# Patient Record
Sex: Female | Born: 1955 | Race: White | Hispanic: No | Smoking: Never smoker
Health system: Southern US, Community
[De-identification: ages and names within clinical notes are randomized; demographics above are authoritative.]

## PROBLEM LIST (undated history)

## (undated) DIAGNOSIS — F419 Anxiety disorder, unspecified: Secondary | ICD-10-CM

## (undated) DIAGNOSIS — I10 Essential (primary) hypertension: Secondary | ICD-10-CM

## (undated) DIAGNOSIS — E7800 Pure hypercholesterolemia, unspecified: Secondary | ICD-10-CM

## (undated) HISTORY — PX: ABDOMINAL HYSTERECTOMY: SHX81

## (undated) HISTORY — PX: BREAST SURGERY: SHX581

## (undated) HISTORY — PX: DEBRIDEMENT TENNIS ELBOW: SHX1442

## (undated) HISTORY — PX: OTHER SURGICAL HISTORY: SHX169

## (undated) HISTORY — PX: APPENDECTOMY: SHX54

## (undated) HISTORY — PX: LIPOMA EXCISION: SHX5283

---

## 2011-09-05 ENCOUNTER — Encounter (HOSPITAL_COMMUNITY): Payer: Self-pay | Admitting: Pharmacy Technician

## 2011-09-05 ENCOUNTER — Other Ambulatory Visit (HOSPITAL_COMMUNITY): Payer: Self-pay | Admitting: Plastic Surgery

## 2011-09-05 ENCOUNTER — Other Ambulatory Visit: Payer: Self-pay | Admitting: Radiology

## 2011-09-05 ENCOUNTER — Encounter (HOSPITAL_COMMUNITY): Payer: Self-pay

## 2011-09-05 ENCOUNTER — Ambulatory Visit (HOSPITAL_COMMUNITY)
Admission: RE | Admit: 2011-09-05 | Discharge: 2011-09-05 | Disposition: A | Discharge status: Home / Self Care | Payer: 59 | Source: Ambulatory Visit | Attending: Plastic Surgery | Admitting: Plastic Surgery

## 2011-09-05 DIAGNOSIS — IMO0002 Reserved for concepts with insufficient information to code with codable children: Secondary | ICD-10-CM

## 2011-09-05 DIAGNOSIS — Y849 Medical procedure, unspecified as the cause of abnormal reaction of the patient, or of later complication, without mention of misadventure at the time of the procedure: Secondary | ICD-10-CM | POA: Insufficient documentation

## 2011-09-05 HISTORY — DX: Essential (primary) hypertension: I10

## 2011-09-05 HISTORY — DX: Anxiety disorder, unspecified: F41.9

## 2011-09-05 LAB — CBC
MCH: 32.4 pg (ref 26.0–34.0)
MCV: 93.8 fL (ref 78.0–100.0)
Platelets: 228 10*3/uL (ref 150–400)
RBC: 4.17 MIL/uL (ref 3.87–5.11)

## 2011-09-05 MED ORDER — SODIUM CHLORIDE 0.9 % IV SOLN: Freq: Once | INTRAVENOUS | Status: DC

## 2011-09-05 MED ORDER — MIDAZOLAM HCL 2 MG/2ML IJ SOLN
INTRAMUSCULAR | Status: AC
  Filled 2011-09-05: qty 4

## 2011-09-05 MED ORDER — FENTANYL CITRATE 0.05 MG/ML IJ SOLN
INTRAMUSCULAR | Status: AC
  Filled 2011-09-05: qty 4

## 2011-09-05 NOTE — Procedures (Signed)
Placement of drainage catheter (10 [place]) in back seroma with ultrasound guidance.  Removed 40 ml of red serous fluid.  No immediate complication.

## 2011-09-05 NOTE — ED Notes (Signed)
Pt sent home.  No medication given during procedure

## 2011-09-05 NOTE — H&P (Signed)
Sherry Dixon is an 56 y.o. female.   Chief Complaint: back lipoma surgery 08/20/11 Seroma developed. Has had aspirated x 2 in office 5/28 and 6/3: 60 cc each time: bloody serous fluid Re accumulated now Scheduled for [place] and aspiration vs drain placement HPI: HTN; anxiety  Past Medical History  Diagnosis Date  . Hypertension   . Anxiety     Past Surgical History  Procedure Date  . Breast surgery     augmentation  . Abdominal hysterectomy   . Lipoma excision   . Bunyon   . Debridement tennis elbow     No family history on file. Social History:  does not have a smoking history on file. She has never used smokeless tobacco. Her alcohol and drug histories not on file.  Allergies: Allergies not on file   (Not in a hospital admission)  No results found for this or any previous visit (from the past 48 hour(s)). No results found.  Review of Systems  Constitutional: Negative for fever.  Cardiovascular: Negative for chest pain.  Gastrointestinal: Negative for nausea and vomiting.  Musculoskeletal: Positive for back pain.  Neurological: Negative for dizziness and headaches.  Psychiatric/Behavioral: The patient is nervous/anxious.     Blood pressure 157/90, pulse 62, temperature 97 F (36.1 C), temperature source Oral, resp. rate 18, height 5\' 6"  (1.676 m), weight 145 lb (65.772 kg), SpO2 97.00%. Physical Exam  Constitutional: She is oriented to person, place, and time. She appears well-developed.  Cardiovascular: Normal rate, regular rhythm and normal heart sounds.   No murmur heard. Respiratory: Effort normal and breath sounds normal. She has no wheezes. She exhibits tenderness.       Mid to left back lipoma surgery 08/20/11 Seroma now  GI: Soft. Bowel sounds are normal. There is no tenderness.  Musculoskeletal: Normal range of motion.  Neurological: She is alert and oriented to person, place, and time.  Skin: Skin is warm and dry.  Psychiatric: She has a normal mood and  affect. Her behavior is normal. Judgment and thought content normal.     Assessment/Plan Back lipoma removed 08/20/11 Seroma aspirated x 2 - in office; reacumulation now Scheduled for [place] and aspiration vs drain placement Pt aware of procedure benefits and risks and agreeable to proceed. Consent signed.     Satoru Milich A 09/05/2011, 1:21 PM

## 2011-09-05 NOTE — ED Notes (Signed)
Pt showed how to work JP drain

## 2019-11-27 ENCOUNTER — Emergency Department (HOSPITAL_COMMUNITY): Payer: BLUE CROSS/BLUE SHIELD | Admitting: Registered Nurse

## 2019-11-27 ENCOUNTER — Encounter (HOSPITAL_BASED_OUTPATIENT_CLINIC_OR_DEPARTMENT_OTHER): Payer: Self-pay | Admitting: Emergency Medicine

## 2019-11-27 ENCOUNTER — Observation Stay (HOSPITAL_BASED_OUTPATIENT_CLINIC_OR_DEPARTMENT_OTHER)
Admission: EM | Admit: 2019-11-27 | Discharge: 2019-11-29 | Disposition: A | Discharge status: Home / Self Care | Payer: BLUE CROSS/BLUE SHIELD | Attending: General Surgery | Admitting: General Surgery

## 2019-11-27 ENCOUNTER — Encounter (HOSPITAL_COMMUNITY)
Admission: EM | Disposition: A | Discharge status: Home / Self Care | Payer: Self-pay | Source: Home / Self Care | Attending: Emergency Medicine

## 2019-11-27 ENCOUNTER — Other Ambulatory Visit: Payer: Self-pay

## 2019-11-27 ENCOUNTER — Emergency Department (HOSPITAL_BASED_OUTPATIENT_CLINIC_OR_DEPARTMENT_OTHER): Payer: BLUE CROSS/BLUE SHIELD

## 2019-11-27 DIAGNOSIS — Z79899 Other long term (current) drug therapy: Secondary | ICD-10-CM | POA: Diagnosis not present

## 2019-11-27 DIAGNOSIS — Z20822 Contact with and (suspected) exposure to covid-19: Secondary | ICD-10-CM | POA: Diagnosis not present

## 2019-11-27 DIAGNOSIS — Z7982 Long term (current) use of aspirin: Secondary | ICD-10-CM | POA: Insufficient documentation

## 2019-11-27 DIAGNOSIS — K3532 Acute appendicitis with perforation and localized peritonitis, without abscess: Principal | ICD-10-CM | POA: Insufficient documentation

## 2019-11-27 DIAGNOSIS — K353 Acute appendicitis with localized peritonitis, without perforation or gangrene: Secondary | ICD-10-CM

## 2019-11-27 DIAGNOSIS — I10 Essential (primary) hypertension: Secondary | ICD-10-CM | POA: Insufficient documentation

## 2019-11-27 DIAGNOSIS — R109 Unspecified abdominal pain: Secondary | ICD-10-CM | POA: Diagnosis present

## 2019-11-27 DIAGNOSIS — K3580 Unspecified acute appendicitis: Secondary | ICD-10-CM | POA: Diagnosis present

## 2019-11-27 HISTORY — PX: LAPAROSCOPIC APPENDECTOMY: SHX408

## 2019-11-27 HISTORY — DX: Pure hypercholesterolemia, unspecified: E78.00

## 2019-11-27 LAB — CREATININE, SERUM
Creatinine, Ser: 0.87 mg/dL (ref 0.44–1.00)
GFR calc Af Amer: >60 mL/min (ref >60)
GFR calc non Af Amer: >60 mL/min (ref >60)

## 2019-11-27 LAB — COMPREHENSIVE METABOLIC PANEL
ALT: 16 U/L (ref 0–44)
AST: 22 U/L (ref 15–41)
Albumin: 4 g/dL (ref 3.5–5.0)
Alkaline Phosphatase: 51 U/L (ref 38–126)
Anion gap: 13 (ref 5–15)
BUN: 23 mg/dL (ref 8–23)
CO2: 25 mmol/L (ref 22–32)
Calcium: 9.4 mg/dL (ref 8.9–10.3)
Chloride: 97 mmol/L — ABNORMAL LOW (ref 98–111)
Creatinine, Ser: 0.95 mg/dL (ref 0.44–1.00)
GFR calc Af Amer: >60 mL/min (ref >60)
GFR calc non Af Amer: >60 mL/min (ref >60)
Glucose, Bld: 128 mg/dL — ABNORMAL HIGH (ref 70–99)
Potassium: 4 mmol/L (ref 3.5–5.1)
Sodium: 135 mmol/L (ref 135–145)
Total Bilirubin: 1.3 mg/dL — ABNORMAL HIGH (ref 0.3–1.2)
Total Protein: 7.9 g/dL (ref 6.5–8.1)

## 2019-11-27 LAB — CBC WITH DIFFERENTIAL/PLATELET
Abs Immature Granulocytes: 0.06 10*3/uL (ref 0.00–0.07)
Basophils Absolute: 0.1 10*3/uL (ref 0.0–0.1)
Basophils Relative: 1 %
Eosinophils Absolute: 0.2 10*3/uL (ref 0.0–0.5)
Eosinophils Relative: 2 %
HCT: 46.9 % — ABNORMAL HIGH (ref 36.0–46.0)
Hemoglobin: 15.9 g/dL — ABNORMAL HIGH (ref 12.0–15.0)
Immature Granulocytes: 1 %
Lymphocytes Relative: 7 %
Lymphs Abs: 0.9 10*3/uL (ref 0.7–4.0)
MCH: 33.1 pg (ref 26.0–34.0)
MCHC: 33.9 g/dL (ref 30.0–36.0)
MCV: 97.5 fL (ref 80.0–100.0)
Monocytes Absolute: 0.9 10*3/uL (ref 0.1–1.0)
Monocytes Relative: 7 %
Neutro Abs: 10.5 10*3/uL — ABNORMAL HIGH (ref 1.7–7.7)
Neutrophils Relative %: 82 %
Platelets: 253 10*3/uL (ref 150–400)
RBC: 4.81 MIL/uL (ref 3.87–5.11)
RDW: 12.9 % (ref 11.5–15.5)
WBC: 12.6 10*3/uL — ABNORMAL HIGH (ref 4.0–10.5)
nRBC: 0 % (ref 0.0–0.2)

## 2019-11-27 LAB — CBC
HCT: 38.5 % (ref 36.0–46.0)
Hemoglobin: 12.9 g/dL (ref 12.0–15.0)
MCH: 33.6 pg (ref 26.0–34.0)
MCHC: 33.5 g/dL (ref 30.0–36.0)
MCV: 100.3 fL — ABNORMAL HIGH (ref 80.0–100.0)
Platelets: 200 10*3/uL (ref 150–400)
RBC: 3.84 MIL/uL — ABNORMAL LOW (ref 3.87–5.11)
RDW: 13.2 % (ref 11.5–15.5)
WBC: 10.8 10*3/uL — ABNORMAL HIGH (ref 4.0–10.5)
nRBC: 0 % (ref 0.0–0.2)

## 2019-11-27 LAB — URINALYSIS, ROUTINE W REFLEX MICROSCOPIC
Glucose, UA: NEGATIVE mg/dL
Ketones, ur: NEGATIVE mg/dL
Leukocytes,Ua: NEGATIVE
Nitrite: NEGATIVE
Protein, ur: 30 mg/dL — AB
Specific Gravity, Urine: >1.03 — ABNORMAL HIGH (ref 1.005–1.030)
pH: 6 (ref 5.0–8.0)

## 2019-11-27 LAB — URINALYSIS, MICROSCOPIC (REFLEX)

## 2019-11-27 LAB — SARS CORONAVIRUS 2 BY RT PCR (HOSPITAL ORDER, PERFORMED IN ~~LOC~~ HOSPITAL LAB): SARS Coronavirus 2: NEGATIVE

## 2019-11-27 SURGERY — APPENDECTOMY, LAPAROSCOPIC
Anesthesia: General | Site: Abdomen

## 2019-11-27 MED ORDER — ONDANSETRON HCL 4 MG/2ML IJ SOLN
INTRAMUSCULAR | Status: AC
  Filled 2019-11-27: qty 2

## 2019-11-27 MED ORDER — IOHEXOL 300 MG/ML  SOLN
100.0000 mL | Freq: Once | INTRAMUSCULAR | Status: AC | PRN
  Administered 2019-11-27: 100 mL via INTRAVENOUS

## 2019-11-27 MED ORDER — METOPROLOL TARTRATE 5 MG/5ML IV SOLN: 5.0000 mg | Freq: Four times a day (QID) | INTRAVENOUS | Status: DC | PRN

## 2019-11-27 MED ORDER — HYDROCHLOROTHIAZIDE 25 MG PO TABS
25.0000 mg | ORAL_TABLET | Freq: Every day | ORAL | Status: DC
  Administered 2019-11-27: 25 mg via ORAL
  Filled 2019-11-27: qty 1

## 2019-11-27 MED ORDER — GABAPENTIN 300 MG PO CAPS
300.0000 mg | ORAL_CAPSULE | Freq: Two times a day (BID) | ORAL | Status: DC
  Administered 2019-11-27 – 2019-11-28 (×2): 300 mg via ORAL
  Filled 2019-11-27 (×2): qty 1

## 2019-11-27 MED ORDER — 0.9 % SODIUM CHLORIDE (POUR BTL) OPTIME
TOPICAL | Status: DC | PRN
  Administered 2019-11-27: 1000 mL

## 2019-11-27 MED ORDER — LOSARTAN POTASSIUM 25 MG PO TABS: 25.0000 mg | ORAL_TABLET | Freq: Every day | ORAL | Status: DC

## 2019-11-27 MED ORDER — ONDANSETRON HCL 4 MG/2ML IJ SOLN: 4.0000 mg | Freq: Four times a day (QID) | INTRAMUSCULAR | Status: DC | PRN

## 2019-11-27 MED ORDER — BOOST / RESOURCE BREEZE PO LIQD CUSTOM
1.0000 | Freq: Three times a day (TID) | ORAL | Status: DC
  Administered 2019-11-27 – 2019-11-28 (×2): 1 via ORAL

## 2019-11-27 MED ORDER — OXYCODONE HCL 5 MG PO TABS
5.0000 mg | ORAL_TABLET | ORAL | Status: DC | PRN
  Administered 2019-11-27 – 2019-11-29 (×4): 10 mg via ORAL
  Filled 2019-11-27: qty 1
  Filled 2019-11-27 (×4): qty 2

## 2019-11-27 MED ORDER — DEXAMETHASONE SODIUM PHOSPHATE 10 MG/ML IJ SOLN
INTRAMUSCULAR | Status: DC | PRN
  Administered 2019-11-27: 10 mg via INTRAVENOUS

## 2019-11-27 MED ORDER — ATORVASTATIN CALCIUM 10 MG PO TABS
10.0000 mg | ORAL_TABLET | Freq: Every day | ORAL | Status: DC
  Administered 2019-11-27: 20:00:00 10 mg via ORAL
  Filled 2019-11-27: qty 1

## 2019-11-27 MED ORDER — DIPHENHYDRAMINE HCL 12.5 MG/5ML PO ELIX
12.5000 mg | ORAL_SOLUTION | Freq: Four times a day (QID) | ORAL | Status: DC | PRN
  Administered 2019-11-29: 08:00:00 12.5 mg via ORAL
  Filled 2019-11-27: qty 5

## 2019-11-27 MED ORDER — SIMETHICONE 80 MG PO CHEW: 40.0000 mg | CHEWABLE_TABLET | Freq: Four times a day (QID) | ORAL | Status: DC | PRN

## 2019-11-27 MED ORDER — PIPERACILLIN-TAZOBACTAM 3.375 G IVPB
3.3750 g | Freq: Three times a day (TID) | INTRAVENOUS | Status: DC
  Administered 2019-11-27: 3.375 g via INTRAVENOUS
  Filled 2019-11-27: qty 50

## 2019-11-27 MED ORDER — KCL IN DEXTROSE-NACL 20-5-0.45 MEQ/L-%-% IV SOLN
INTRAVENOUS | Status: DC
  Filled 2019-11-27 (×3): qty 1000

## 2019-11-27 MED ORDER — ASPIRIN EC 81 MG PO TBEC: 81.0000 mg | DELAYED_RELEASE_TABLET | Freq: Every day | ORAL | Status: DC

## 2019-11-27 MED ORDER — MIDAZOLAM HCL 5 MG/5ML IJ SOLN
INTRAMUSCULAR | Status: DC | PRN
  Administered 2019-11-27: 2 mg via INTRAVENOUS

## 2019-11-27 MED ORDER — KETOROLAC TROMETHAMINE 30 MG/ML IJ SOLN: 30.0000 mg | Freq: Once | INTRAMUSCULAR | Status: DC

## 2019-11-27 MED ORDER — FENTANYL CITRATE (PF) 100 MCG/2ML IJ SOLN
INTRAMUSCULAR | Status: DC | PRN
  Administered 2019-11-27 (×5): 50 ug via INTRAVENOUS

## 2019-11-27 MED ORDER — POLYETHYLENE GLYCOL 3350 17 G PO PACK: 17.0000 g | PACK | Freq: Every day | ORAL | Status: DC | PRN

## 2019-11-27 MED ORDER — SUCCINYLCHOLINE CHLORIDE 200 MG/10ML IV SOSY
PREFILLED_SYRINGE | INTRAVENOUS | Status: AC
  Filled 2019-11-27: qty 10

## 2019-11-27 MED ORDER — EPHEDRINE SULFATE 50 MG/ML IJ SOLN
INTRAMUSCULAR | Status: DC | PRN
  Administered 2019-11-27: 10 mg via INTRAVENOUS

## 2019-11-27 MED ORDER — PHENYLEPHRINE HCL (PRESSORS) 10 MG/ML IV SOLN
INTRAVENOUS | Status: DC | PRN
  Administered 2019-11-27: 80 ug via INTRAVENOUS
  Administered 2019-11-27: 160 ug via INTRAVENOUS

## 2019-11-27 MED ORDER — LACTATED RINGERS IV SOLN: INTRAVENOUS | Status: DC | PRN

## 2019-11-27 MED ORDER — ROCURONIUM BROMIDE 10 MG/ML (PF) SYRINGE
PREFILLED_SYRINGE | INTRAVENOUS | Status: AC
  Filled 2019-11-27: qty 10

## 2019-11-27 MED ORDER — SODIUM CHLORIDE 0.9 % IV SOLN
INTRAVENOUS | Status: DC | PRN
  Administered 2019-11-27: 250 mL via INTRAVENOUS

## 2019-11-27 MED ORDER — FENTANYL CITRATE (PF) 100 MCG/2ML IJ SOLN
INTRAMUSCULAR | Status: AC
  Filled 2019-11-27: qty 2

## 2019-11-27 MED ORDER — ACETAMINOPHEN 10 MG/ML IV SOLN: 1000.0000 mg | Freq: Once | INTRAVENOUS | Status: DC | PRN

## 2019-11-27 MED ORDER — ONDANSETRON HCL 4 MG/2ML IJ SOLN
4.0000 mg | Freq: Once | INTRAMUSCULAR | Status: AC
  Administered 2019-11-27: 4 mg via INTRAVENOUS
  Filled 2019-11-27: qty 2

## 2019-11-27 MED ORDER — PROPOFOL 10 MG/ML IV BOLUS
INTRAVENOUS | Status: DC | PRN
  Administered 2019-11-27: 150 mg via INTRAVENOUS

## 2019-11-27 MED ORDER — ENOXAPARIN SODIUM 40 MG/0.4ML ~~LOC~~ SOLN
40.0000 mg | SUBCUTANEOUS | Status: DC
  Administered 2019-11-28 – 2019-11-29 (×2): 40 mg via SUBCUTANEOUS
  Filled 2019-11-27 (×2): qty 0.4

## 2019-11-27 MED ORDER — METHOCARBAMOL 500 MG PO TABS
500.0000 mg | ORAL_TABLET | Freq: Four times a day (QID) | ORAL | Status: DC | PRN
  Administered 2019-11-28: 17:00:00 500 mg via ORAL
  Filled 2019-11-27 (×2): qty 1

## 2019-11-27 MED ORDER — FENTANYL CITRATE (PF) 100 MCG/2ML IJ SOLN
100.0000 ug | Freq: Once | INTRAMUSCULAR | Status: AC
  Administered 2019-11-27: 100 ug via INTRAVENOUS
  Filled 2019-11-27: qty 2

## 2019-11-27 MED ORDER — ACETAMINOPHEN 500 MG PO TABS
1000.0000 mg | ORAL_TABLET | Freq: Four times a day (QID) | ORAL | Status: DC
  Administered 2019-11-27 – 2019-11-29 (×6): 1000 mg via ORAL
  Filled 2019-11-27 (×7): qty 2

## 2019-11-27 MED ORDER — FENTANYL CITRATE (PF) 250 MCG/5ML IJ SOLN
INTRAMUSCULAR | Status: AC
Start: 2019-11-27 — End: ?
  Filled 2019-11-27: qty 5

## 2019-11-27 MED ORDER — IBUPROFEN 400 MG PO TABS: 600.0000 mg | ORAL_TABLET | Freq: Four times a day (QID) | ORAL | Status: DC | PRN

## 2019-11-27 MED ORDER — SUCCINYLCHOLINE CHLORIDE 200 MG/10ML IV SOSY
PREFILLED_SYRINGE | INTRAVENOUS | Status: DC | PRN
  Administered 2019-11-27: 120 mg via INTRAVENOUS

## 2019-11-27 MED ORDER — FENTANYL CITRATE (PF) 100 MCG/2ML IJ SOLN
25.0000 ug | INTRAMUSCULAR | Status: DC | PRN
  Administered 2019-11-28 (×2): 25 ug via INTRAVENOUS
  Filled 2019-11-27 (×2): qty 2

## 2019-11-27 MED ORDER — ROCURONIUM BROMIDE 10 MG/ML (PF) SYRINGE
PREFILLED_SYRINGE | INTRAVENOUS | Status: DC | PRN
  Administered 2019-11-27: 40 mg via INTRAVENOUS

## 2019-11-27 MED ORDER — ONDANSETRON HCL 4 MG/2ML IJ SOLN
INTRAMUSCULAR | Status: DC | PRN
  Administered 2019-11-27: 4 mg via INTRAVENOUS

## 2019-11-27 MED ORDER — ONDANSETRON 4 MG PO TBDP: 4.0000 mg | ORAL_TABLET | Freq: Four times a day (QID) | ORAL | Status: DC | PRN

## 2019-11-27 MED ORDER — FENTANYL CITRATE (PF) 100 MCG/2ML IJ SOLN: 25.0000 ug | INTRAMUSCULAR | Status: DC | PRN

## 2019-11-27 MED ORDER — LACTATED RINGERS IV SOLN
INTRAVENOUS | Status: AC | PRN
  Administered 2019-11-27: 1000 mL

## 2019-11-27 MED ORDER — PHENYLEPHRINE 40 MCG/ML (10ML) SYRINGE FOR IV PUSH (FOR BLOOD PRESSURE SUPPORT)
PREFILLED_SYRINGE | INTRAVENOUS | Status: AC
  Filled 2019-11-27: qty 10

## 2019-11-27 MED ORDER — BUPIVACAINE-EPINEPHRINE 0.25% -1:200000 IJ SOLN
INTRAMUSCULAR | Status: DC | PRN
  Administered 2019-11-27: 10 mL

## 2019-11-27 MED ORDER — DEXAMETHASONE SODIUM PHOSPHATE 10 MG/ML IJ SOLN
INTRAMUSCULAR | Status: AC
  Filled 2019-11-27: qty 1

## 2019-11-27 MED ORDER — MIDAZOLAM HCL 2 MG/2ML IJ SOLN
INTRAMUSCULAR | Status: AC
  Filled 2019-11-27: qty 2

## 2019-11-27 MED ORDER — LIDOCAINE 2% (20 MG/ML) 5 ML SYRINGE
INTRAMUSCULAR | Status: AC
  Filled 2019-11-27: qty 5

## 2019-11-27 MED ORDER — DIPHENHYDRAMINE HCL 50 MG/ML IJ SOLN: 12.5000 mg | Freq: Four times a day (QID) | INTRAMUSCULAR | Status: DC | PRN

## 2019-11-27 MED ORDER — PIPERACILLIN-TAZOBACTAM 3.375 G IVPB: 3.3750 g | Freq: Three times a day (TID) | INTRAVENOUS | Status: DC

## 2019-11-27 MED ORDER — PROPOFOL 10 MG/ML IV BOLUS
INTRAVENOUS | Status: AC
  Filled 2019-11-27: qty 20

## 2019-11-27 MED ORDER — SODIUM CHLORIDE 0.9 % IV BOLUS
1000.0000 mL | Freq: Once | INTRAVENOUS | Status: AC
  Administered 2019-11-27: 1000 mL via INTRAVENOUS

## 2019-11-27 MED ORDER — PROMETHAZINE HCL 25 MG/ML IJ SOLN: 6.2500 mg | INTRAMUSCULAR | Status: DC | PRN

## 2019-11-27 MED ORDER — BUPIVACAINE-EPINEPHRINE 0.25% -1:200000 IJ SOLN
INTRAMUSCULAR | Status: AC
  Filled 2019-11-27: qty 1

## 2019-11-27 MED ORDER — LIDOCAINE 2% (20 MG/ML) 5 ML SYRINGE
INTRAMUSCULAR | Status: DC | PRN
  Administered 2019-11-27: 100 mg via INTRAVENOUS

## 2019-11-27 MED ORDER — EPHEDRINE 5 MG/ML INJ
INTRAVENOUS | Status: AC
  Filled 2019-11-27: qty 10

## 2019-11-27 MED ORDER — SUGAMMADEX SODIUM 200 MG/2ML IV SOLN
INTRAVENOUS | Status: DC | PRN
  Administered 2019-11-27: 150 mg via INTRAVENOUS

## 2019-11-27 SURGICAL SUPPLY — 34 items
APPLIER CLIP 5 13 M/L LIGAMAX5 (MISCELLANEOUS)
APPLIER CLIP ROT 10 11.4 M/L (STAPLE)
CHLORAPREP W/TINT 26 (MISCELLANEOUS) ×3 IMPLANT
CLIP APPLIE 5 13 M/L LIGAMAX5 (MISCELLANEOUS) IMPLANT
CLIP APPLIE ROT 10 11.4 M/L (STAPLE) IMPLANT
CLIP VESOLOCK XL 6/CT (CLIP) ×3 IMPLANT
COVER TRANSDUCER ULTRASND (DRAPES) ×3 IMPLANT
COVER WAND RF STERILE (DRAPES) IMPLANT
DECANTER SPIKE VIAL GLASS SM (MISCELLANEOUS) ×3 IMPLANT
DERMABOND ADVANCED (GAUZE/BANDAGES/DRESSINGS) ×2
DERMABOND ADVANCED .7 DNX12 (GAUZE/BANDAGES/DRESSINGS) ×1 IMPLANT
DEVICE TROCAR PUNCTURE CLOSURE (ENDOMECHANICALS) ×3 IMPLANT
ELECT REM PT RETURN 15FT ADLT (MISCELLANEOUS) ×3 IMPLANT
ENDOLOOP SUT PDS II  0 18 (SUTURE)
ENDOLOOP SUT PDS II 0 18 (SUTURE) IMPLANT
GLOVE BIO SURGEON STRL SZ7.5 (GLOVE) ×3 IMPLANT
GOWN STRL REUS W/TWL XL LVL3 (GOWN DISPOSABLE) ×6 IMPLANT
KIT BASIN OR (CUSTOM PROCEDURE TRAY) ×3 IMPLANT
KIT TURNOVER KIT A (KITS) IMPLANT
NEEDLE INSUFFLATION 14GA 120MM (NEEDLE) ×3 IMPLANT
PENCIL SMOKE EVACUATOR (MISCELLANEOUS) IMPLANT
SCISSORS LAP 5X35 DISP (ENDOMECHANICALS) ×3 IMPLANT
SET IRRIG TUBING LAPAROSCOPIC (IRRIGATION / IRRIGATOR) ×3 IMPLANT
SET TUBE SMOKE EVAC HIGH FLOW (TUBING) ×3 IMPLANT
SLEEVE XCEL OPT CAN 5 100 (ENDOMECHANICALS) ×3 IMPLANT
SUT MNCRL AB 4-0 PS2 18 (SUTURE) ×3 IMPLANT
SUT SILK 3 0 SH 30 (SUTURE) ×3 IMPLANT
SUT VICRYL 0 UR6 27IN ABS (SUTURE) ×3 IMPLANT
TOWEL OR 17X26 10 PK STRL BLUE (TOWEL DISPOSABLE) ×3 IMPLANT
TOWEL OR NON WOVEN STRL DISP B (DISPOSABLE) ×3 IMPLANT
TRAY FOLEY MTR SLVR 14FR STAT (SET/KITS/TRAYS/PACK) ×3 IMPLANT
TRAY LAPAROSCOPIC (CUSTOM PROCEDURE TRAY) ×3 IMPLANT
TROCAR BLADELESS OPT 5 100 (ENDOMECHANICALS) ×3 IMPLANT
TROCAR XCEL NON-BLD 11X100MML (ENDOMECHANICALS) ×3 IMPLANT

## 2019-11-27 NOTE — Anesthesia Procedure Notes (Signed)
Procedure Name: Intubation Date/Time: 11/27/2019 2:25 PM Performed by: Lissa Morales, CRNA Pre-anesthesia Checklist: Patient identified, Emergency Drugs available, Suction available and Patient being monitored Patient Re-evaluated:Patient Re-evaluated prior to induction Oxygen Delivery Method: Circle system utilized Preoxygenation: Pre-oxygenation with 100% oxygen Induction Type: IV induction Laryngoscope Size: Mac and 4 Tube type: Oral Tube size: 7.0 mm Number of attempts: 1 Airway Equipment and Method: Stylet and Oral airway Placement Confirmation: ETT inserted through vocal cords under direct vision,  positive ETCO2 and breath sounds checked- equal and bilateral Secured at: 21 cm Tube secured with: Tape Dental Injury: Teeth and Oropharynx as per pre-operative assessment

## 2019-11-27 NOTE — ED Provider Notes (Signed)
[facility] EMERGENCY DEPARTMENT Provider Note   CSN: [id number] Arrival date & time: 11/27/19  0941     History Chief Complaint  Patient presents with  . Abdominal Pain    Sherry Dixon is a 64 y.o. female.  HPI      64yo female with history of htn presents with concern for abdominal pain. Reports mild pain developed Tuesday with significant worsening last night. Across the lower abdomen, sharp pain, worsened with movement, worsened with hitting bumps in the road on the way were.  Called EMS last night due to severe pain but was told the ED wait and then declined transport, wanting to talk to her ED physician son, Dr. Criss Alvine in the AM. Denies known fevers, diarrhea, vomiting, urinary symptoms. Low appetite.  Describes the pain as worse or comparable to natural childbirth but more continuous.   Past Medical History:  Diagnosis Date  . Anxiety   . Hypertension     Patient Active Problem List   Diagnosis Date Noted  . Acute appendicitis 11/27/2019    Past Surgical History:  Procedure Laterality Date  . ABDOMINAL HYSTERECTOMY    . BREAST SURGERY     augmentation  . bunyon    . DEBRIDEMENT TENNIS ELBOW    . LIPOMA EXCISION       OB History   No obstetric history on file.     History reviewed. No pertinent family history.  Social History   Tobacco Use  . Smoking status: Never Smoker  . Smokeless tobacco: Never Used  Substance Use Topics  . Alcohol use: Not on file  . Drug use: Not on file    Home Medications Prior to Admission medications   Medication Sig Start Date End Date Taking? Authorizing Provider  aspirin 325 MG tablet Take 325 mg by mouth daily.   Yes [provider]  atorvastatin (LIPITOR) 20 MG tablet Take 20 mg by mouth daily.   Yes [provider]  Calcium Carbonate-Vit D-Min (CALTRATE 600+D PLUS MINERALS) 600-800 MG-UNIT TABS Take 1 tablet by mouth in the morning and at bedtime.   Yes [provider]    hydrochlorothiazide (HYDRODIURIL) 25 MG tablet Take 25 mg by mouth daily.   Yes [provider]  ibuprofen (ADVIL) 200 MG tablet Take 400 mg by mouth every 6 (six) hours as needed for fever, headache, mild pain, moderate pain or cramping.   Yes [provider]  losartan (COZAAR) 25 MG tablet Take 25 mg by mouth daily.   Yes [provider]  metoprolol succinate (TOPROL-XL) 25 MG 24 hr tablet Take 25 mg by mouth daily.   Yes [provider]  Rhubarb (ESTROVEN MENOPAUSE RELIEF) 4 MG TABS Take 4 mg by mouth daily.   Yes [provider]  lisinopril (PRINIVIL,ZESTRIL) 10 MG tablet Take 10 mg by mouth daily.    [provider]    Allergies    Ace inhibitors; Ambien [zolpidem tartrate]; Flagyl [metronidazole]; Hydrocodone-acetaminophen; Morphine and related; Norvasc [amlodipine]; Sulfa antibiotics; and Antihistamines, loratadine-type  Review of Systems   Review of Systems  Constitutional: Positive for appetite change. Negative for fever.  HENT: Negative for sore throat.   Eyes: Negative for visual disturbance.  Respiratory: Negative for cough and shortness of breath.   Cardiovascular: Negative for chest pain.  Gastrointestinal: Positive for abdominal pain. Negative for diarrhea, nausea and vomiting.  Genitourinary: Negative for difficulty urinating and dysuria.  Musculoskeletal: Negative for back pain.  Skin: Negative for rash.  Neurological:  Negative for syncope.    Physical Exam Updated Vital Signs BP 100/65 (BP Location: Left Arm)   Pulse 74   Temp 98.3 F (36.8 C) (Oral)   Resp 18   Ht 5\' 6"  (1.676 m)   Wt 64.5 kg   SpO2 91%   BMI 22.95 kg/m   Physical Exam Vitals and nursing note reviewed.  Constitutional:      General: She is not in acute distress.    Appearance: Normal appearance. She is not ill-appearing, toxic-appearing or diaphoretic.  HENT:     Head: Normocephalic.  Eyes:     Conjunctiva/sclera: Conjunctivae  normal.  Cardiovascular:     Rate and Rhythm: Normal rate and regular rhythm.     Pulses: Normal pulses.  Pulmonary:     Effort: Pulmonary effort is normal. No respiratory distress.  Abdominal:     General: Abdomen is flat.     Tenderness: There is abdominal tenderness in the right lower quadrant, suprapubic area and left lower quadrant.  Musculoskeletal:        General: No deformity or signs of injury.     Cervical back: No rigidity.  Skin:    General: Skin is warm and dry.     Coloration: Skin is not jaundiced or pale.  Neurological:     General: No focal deficit present.     Mental Status: She is alert and oriented to person, place, and time.     ED Results / Procedures / Treatments   Labs (all labs ordered are listed, but only abnormal results are displayed) Labs Reviewed  CBC WITH DIFFERENTIAL/PLATELET - Abnormal; Notable for the following components:      Result Value   WBC 12.6 (*)    Hemoglobin 15.9 (*)    HCT 46.9 (*)    Neutro Abs 10.5 (*)    All other components within normal limits  COMPREHENSIVE METABOLIC PANEL - Abnormal; Notable for the following components:   Chloride 97 (*)    Glucose, Bld 128 (*)    Total Bilirubin 1.3 (*)    All other components within normal limits  URINALYSIS, ROUTINE W REFLEX MICROSCOPIC - Abnormal; Notable for the following components:   APPearance HAZY (*)    Specific Gravity, Urine >1.030 (*)    Hgb urine dipstick TRACE (*)    Bilirubin Urine SMALL (*)    Protein, ur 30 (*)    All other components within normal limits  URINALYSIS, MICROSCOPIC (REFLEX) - Abnormal; Notable for the following components:   Bacteria, UA MANY (*)    All other components within normal limits  CBC - Abnormal; Notable for the following components:   WBC 10.8 (*)    RBC 3.84 (*)    MCV 100.3 (*)    All other components within normal limits  SARS CORONAVIRUS 2 BY RT PCR (HOSPITAL ORDER, PERFORMED IN [facility])  URINE CULTURE    CREATININE, SERUM  BASIC METABOLIC PANEL  CBC  SURGICAL PATHOLOGY    EKG None  Radiology CT ABDOMEN PELVIS W CONTRAST  Result Date: 11/27/2019 CLINICAL DATA:  Abdominal pain over the last 3 days. Possible diverticulitis. EXAM: CT ABDOMEN AND PELVIS WITH CONTRAST TECHNIQUE: Multidetector CT imaging of the abdomen and pelvis was performed using the standard protocol following bolus administration of intravenous contrast. CONTRAST:  11/29/2019 OMNIPAQUE IOHEXOL 300 MG/ML  SOLN COMPARISON:  None. FINDINGS: Lower chest: Mild linear scarring or atelectasis at the lung bases. Hepatobiliary: Scattered liver cysts, the largest in the  lateral inferior right lobe measuring up to 4.8 cm in diameter. No calcified gallstones. Pancreas: Normal Spleen: Normal Adrenals/Urinary Tract: Adrenal glands are normal. Multiple bilateral renal cysts. No evidence stone solid mass or hydronephrosis. Bladder is normal. Stomach/Bowel: The stomach is normal. There are marked inflammatory changes in the pelvis. Think the patient has acute appendicitis with multiple appendicoliths. The appendix extends along the right pelvic side wall. There does not appear to be diverticulosis or diverticulitis. Some fluid in the pelvis, not clearly loculated, though early abscess is possible. Vascular/Lymphatic: Aortic atherosclerosis. No aneurysm. IVC is normal. No retroperitoneal adenopathy. Reproductive: Previous hysterectomy.  No pelvic mass. Other: None Musculoskeletal: Ordinary spinal degenerative changes. IMPRESSION: 1. Acute appendicitis with multiple appendicoliths. Marked inflammatory changes in the pelvis. Some fluid in the pelvis, not clearly loculated, though early abscess is possible. Appendix is located low in the pelvis along the right pelvic side wall. 2. Multiple liver and renal cysts. 3. Aortic atherosclerosis. Aortic Atherosclerosis (ICD10-I70.0). Electronically Signed   By: Paulina FusiMark  Shogry M.D.   On: 11/27/2019 11:32     Procedures Procedures (including critical care time)  Medications Ordered in ED Medications  aspirin EC tablet 81 mg (has no administration in time range)  atorvastatin (LIPITOR) tablet 10 mg (0 mg Oral Duplicate 11/27/19 2045)  hydrochlorothiazide (HYDRODIURIL) tablet 25 mg (25 mg Oral Given 11/27/19 1902)  losartan (COZAAR) tablet 25 mg (has no administration in time range)  enoxaparin (LOVENOX) injection 40 mg (has no administration in time range)  dextrose 5 % and 0.45 % NaCl with KCl 20 mEq/L infusion ( Intravenous New Bag/Given 11/27/19 1900)  acetaminophen (TYLENOL) tablet 1,000 mg (1,000 mg Oral Given 11/27/19 1902)  ibuprofen (ADVIL) tablet 600 mg (has no administration in time range)  gabapentin (NEURONTIN) capsule 300 mg (0 mg Oral Duplicate 11/27/19 2045)  oxyCODONE (Oxy IR/ROXICODONE) immediate release tablet 5-10 mg (10 mg Oral Given 11/27/19 1943)  fentaNYL (SUBLIMAZE) injection 25 mcg (has no administration in time range)  diphenhydrAMINE (BENADRYL) 12.5 MG/5ML elixir 12.5 mg (has no administration in time range)    Or  diphenhydrAMINE (BENADRYL) injection 12.5 mg (has no administration in time range)  methocarbamol (ROBAXIN) tablet 500 mg (has no administration in time range)  ondansetron (ZOFRAN-ODT) disintegrating tablet 4 mg (has no administration in time range)    Or  ondansetron (ZOFRAN) injection 4 mg (has no administration in time range)  polyethylene glycol (MIRALAX / GLYCOLAX) packet 17 g (has no administration in time range)  simethicone (MYLICON) chewable tablet 40 mg (has no administration in time range)  metoprolol tartrate (LOPRESSOR) injection 5 mg (has no administration in time range)  feeding supplement (BOOST / RESOURCE BREEZE) liquid 1 Container (1 Container Oral Given 11/27/19 2045)  sodium chloride 0.9 % bolus 1,000 mL ( Intravenous Stopped 11/27/19 1145)  fentaNYL (SUBLIMAZE) injection 100 mcg (100 mcg Intravenous Given 11/27/19 1035)  ondansetron  (ZOFRAN) injection 4 mg (4 mg Intravenous Given 11/27/19 1034)  iohexol (OMNIPAQUE) 300 MG/ML solution 100 mL (100 mLs Intravenous Contrast Given 11/27/19 1102)  fentaNYL (SUBLIMAZE) injection 100 mcg (100 mcg Intravenous Given 11/27/19 1215)  lactated ringers infusion (0 mLs  Stopped 11/27/19 1722)    ED Course  I have reviewed the triage vital signs and the nursing notes.  Pertinent labs & imaging results that were available during my care of the patient were reviewed by me and considered in my medical decision making (see chart for details).    MDM Rules/Calculators/A&P  64yo female with history of htn presents with concern for abdominal pain.  CT shows appendicitis, fluid and inflammatory change in pelvis.  Given zosyn. Consulted Surgery, Dr. Derrell Lolling. Transferred to [facility] short stay for surgery.   Final Clinical Impression(s) / ED Diagnoses Final diagnoses:  Acute appendicitis with localized peritonitis without gangrene, unspecified whether abscess present, unspecified whether perforation present    Rx / DC Orders ED Discharge Orders    None       Alvira Monday, MD 11/27/19 2301

## 2019-11-27 NOTE — Op Note (Signed)
11/27/2019  3:33 PM  PATIENT:  Sherry Dixon  64 y.o. female  PRE-OPERATIVE DIAGNOSIS:  appendicitis  POST-OPERATIVE DIAGNOSIS:  Perforated, gangrenous appendicitis  PROCEDURE:  Procedure(s): APPENDECTOMY LAPAROSCOPIC (N/A)  SURGEON:  Surgeon(s) and Role:    Axel Filler, MD - Primary  ANESTHESIA:   local and general  EBL:  25 mL   BLOOD ADMINISTERED:none  DRAINS: none   LOCAL MEDICATIONS USED:  BUPIVICAINE   SPECIMEN:  Source of Specimen:  appendix  DISPOSITION OF SPECIMEN:  PATHOLOGY  COUNTS:  YES  TOURNIQUET:  * No tourniquets in log *  DICTATION: .Dragon Dictation Complications: none  Counts: reported as correct x 2  Findings:  The patient had a acutely inflamed, gangrenous and perforated appendix  Specimen: Appendix  Indications for procedure:  The patient is a 64 year old female with a history of periumbilical pain localized in the right lower quadrant patient had a CT scan which revealed signs consistent with acute appendicitis the patient back in for laparoscopic appendectomy.  Details of the procedure:The patient was taken back to the operating room. The patient was placed in supine position with bilateral SCDs in place.  A foley catheter was place. The patient was prepped and draped in the usual sterile fashion.  After appropriate anitbiotics were confirmed, a time-out was confirmed and all facts were verified.    A pneumoperitoneum of 14 mmHg was obtained via a Veress needle technique in the left lower quadrant quadrant.  A 5 mm trocar and 5 mm camera then placed intra-abdominally there is no injury to any intra-abdominal organs a 10 mm infraumbilical port was placed and direct visualization as was a 5 mm port in the suprapubic area.   The appendix was identified and seen to be gangrenous and perforated.  The appendix was cleaned down to the appendiceal base. The mesoappendix was then incised and the appendiceal artery was cauterized.  The the  appendiceal base was clean.  A gold hemoclip was placed proximallyx2 and one distally and the appendix was transected between these 2.  The base did appear gangrenous as well.  I did place a 3-0 silk stitch to imbricate the base. A retrieval bag was then placed into the abdomen and the specimen placed in the bag. The appendiceal stump was cauterized. We evacuate the fluid from the pelvis until the effluent was clear.  The appendix and retrieval  bag was then retrieved via the supraumbilical port. #1 Vicryl was used to reapproximate the fascia at the umbilical port site x3. The skin was reapproximated all port sites 3-0 Monocryl subcuticular fashion. The skin was dressed with Dermabond.  The patient had the foley removed. The patient was awakened from general anesthesia was taken to recovery room in stable condition.     PLAN OF CARE: Admit for overnight observation  PATIENT DISPOSITION:  PACU - hemodynamically stable.   Delay start of Pharmacological VTE agent (>24hrs) due to surgical blood loss or risk of bleeding: yes

## 2019-11-27 NOTE — Anesthesia Preprocedure Evaluation (Addendum)
Anesthesia Evaluation  Patient identified by MRN, date of birth, ID band Patient awake    Reviewed: Allergy & Precautions, NPO status , Patient's Chart, lab work & pertinent test results  Airway Mallampati: III  TM Distance: >3 FB Neck ROM: Full    Dental no notable dental hx.    Pulmonary neg pulmonary ROS,    Pulmonary exam normal breath sounds clear to auscultation       Cardiovascular hypertension, Pt. on medications Normal cardiovascular exam Rhythm:Regular Rate:Normal     Neuro/Psych Anxiety negative neurological ROS     GI/Hepatic negative GI ROS, Neg liver ROS,   Endo/Other  negative endocrine ROS  Renal/GU negative Renal ROS     Musculoskeletal negative musculoskeletal ROS (+)   Abdominal   Peds  Hematology HLD   Anesthesia Other Findings appendicitis  Reproductive/Obstetrics                            Anesthesia Physical Anesthesia Plan  ASA: II and emergent  Anesthesia Plan: General   Post-op Pain Management:    Induction: Intravenous  PONV Risk Score and Plan: 4 or greater and Ondansetron, Dexamethasone, Midazolam, Treatment may vary due to age or medical condition and Propofol infusion  Airway Management Planned: Oral ETT  Additional Equipment:   Intra-op Plan:   Post-operative Plan: Extubation in OR  Informed Consent: I have reviewed the patients History and Physical, chart, labs and discussed the procedure including the risks, benefits and alternatives for the proposed anesthesia with the patient or authorized representative who has indicated his/her understanding and acceptance.     Dental advisory given  Plan Discussed with: CRNA  Anesthesia Plan Comments:       Anesthesia Quick Evaluation

## 2019-11-27 NOTE — Discharge Instructions (Signed)
[facility] LAPAROSCOPIC SURGERY: POST OP INSTRUCTIONS Always review your discharge instruction sheet given to you by the facility where your surgery was performed. IF YOU HAVE DISABILITY OR FAMILY LEAVE FORMS, YOU MUST BRING THEM TO THE OFFICE FOR PROCESSING.   DO NOT GIVE THEM TO YOUR DOCTOR.  PAIN CONTROL  1. First take acetaminophen (Tylenol) AND/or ibuprofen (Advil) to control your pain after surgery.  Follow directions on package.  Taking acetaminophen (Tylenol) and/or ibuprofen (Advil) regularly after surgery will help to control your pain and lower the amount of prescription pain medication you may need.  You should not take more than 3,000 mg (3 grams) of acetaminophen (Tylenol) in 24 hours.  You should not take ibuprofen (Advil), aleve, motrin, naprosyn or other NSAIDS if you have a history of stomach ulcers or chronic kidney disease.  2. A prescription for pain medication may be given to you upon discharge.  Take your pain medication as prescribed, if you still have uncontrolled pain after taking acetaminophen (Tylenol) or ibuprofen (Advil). 3. Use ice packs to help control pain. 4. If you need a refill on your pain medication, please contact your pharmacy.  They will contact our office to request authorization. Prescriptions will not be filled after 5pm or on week-ends.  HOME MEDICATIONS 5. Take your usually prescribed medications unless otherwise directed.  DIET 6. You should follow a light diet the first few days after arrival home.  Be sure to include lots of fluids daily. Avoid fatty, fried foods.   CONSTIPATION 7. It is common to experience some constipation after surgery and if you are taking pain medication.  Increasing fluid intake and taking a stool softener (such as Colace) will usually help or prevent this problem from occurring.  A mild laxative (Milk of Magnesia or Miralax) should be taken according to package instructions if there are no bowel  movements after 48 hours.  WOUND/INCISION CARE 8. Most patients will experience some swelling and bruising in the area of the incisions.  Ice packs will help.  Swelling and bruising can take several days to resolve.  9. Unless discharge instructions indicate otherwise, follow guidelines below  a. STERI-STRIPS - you may remove your outer bandages 48 hours after surgery, and you may shower at that time.  You have steri-strips (small skin tapes) in place directly over the incision.  These strips should be left on the skin for 7-10 days.   b. DERMABOND/SKIN GLUE - you may shower in 24 hours.  The glue will flake off over the next 2-3 weeks. 10. Any sutures or staples will be removed at the office during your follow-up visit.  ACTIVITIES 11. You may resume regular (light) daily activities beginning the next day--such as daily self-care, walking, climbing stairs--gradually increasing activities as tolerated.  You may have sexual intercourse when it is comfortable.  Refrain from any heavy lifting or straining until approved by your doctor. a. You may drive when you are no longer taking prescription pain medication, you can comfortably wear a seatbelt, and you can safely maneuver your car and apply brakes.  FOLLOW-UP 12. You should see your doctor in the office for a follow-up appointment approximately 2-3 weeks after your surgery.  You should have been given your post-op/follow-up appointment when your surgery was scheduled.  If you did not receive a post-op/follow-up appointment, make sure that you call for this appointment within a day or two after you arrive home to insure a convenient appointment time.     WHEN TO CALL YOUR DOCTOR: 1. Fever over 101.0 2. Inability to urinate 3. Continued bleeding from incision. 4. Increased pain, redness, or drainage from the incision. 5. Increasing abdominal pain  The clinic staff is available to answer your questions during regular business hours.  Please don't  hesitate to call and ask to speak to one of the nurses for clinical concerns.  If you have a medical emergency, go to the nearest emergency room or call 911.  A surgeon from [facility] is always on call at the hospital. [address], [address], [place], [place]  [address] ? [address] [address], [place], [place]   [address] [phone] ? [phone] ? FAX [phone] Web site: [email or web address]  .........   Managing Your Pain After Surgery Without Opioids    Thank you for participating in our program to help patients manage their pain after surgery without opioids. This is part of our effort to provide you with the best care possible, without exposing you or your family to the risk that opioids pose.  What pain can I expect after surgery? You can expect to have some pain after surgery. This is normal. The pain is typically worse the day after surgery, and quickly begins to get better. Many studies have found that many patients are able to manage their pain after surgery with Over-the-Counter (OTC) medications such as Tylenol and Motrin. If you have a condition that does not allow you to take Tylenol or Motrin, notify your surgical team.  How will I manage my pain? The best strategy for controlling your pain after surgery is around the clock pain control with Tylenol (acetaminophen) and Motrin (ibuprofen or Advil). Alternating these medications with each other allows you to maximize your pain control. In addition to Tylenol and Motrin, you can use heating pads or ice packs on your incisions to help reduce your pain.  How will I alternate your regular strength over-the-counter pain medication? You will take a dose of pain medication every three hours. ; Start by taking 650 mg of Tylenol (2 pills of 325 mg) ; 3 hours later take 600 mg of Motrin (3 pills of 200 mg) ; 3 hours after taking the Motrin take 650 mg of Tylenol ; 3 hours after that take 600 mg of  Motrin.   - 1 -  See example - if your first dose of Tylenol is at 12:00 PM   12:00 PM Tylenol 650 mg (2 pills of 325 mg)  3:00 PM Motrin 600 mg (3 pills of 200 mg)  6:00 PM Tylenol 650 mg (2 pills of 325 mg)  9:00 PM Motrin 600 mg (3 pills of 200 mg)  Continue alternating every 3 hours   We recommend that you follow this schedule around-the-clock for at least 3 days after surgery, or until you feel that it is no longer needed. Use the table on the last page of this handout to keep track of the medications you are taking. Important: Do not take more than 3000mg of Tylenol or 3200mg of Motrin in a 24-hour period. Do not take ibuprofen/Motrin if you have a history of bleeding stomach ulcers, severe kidney disease, &/or actively taking a blood thinner  What if I still have pain? If you have pain that is not controlled with the over-the-counter pain medications (Tylenol and Motrin or Advil) you might have what we call "breakthrough" pain. You will receive a prescription for a small amount of an opioid pain medication such as   Oxycodone, Tramadol, or Tylenol with Codeine. Use these opioid pills in the first 24 hours after surgery if you have breakthrough pain. Do not take more than 1 pill every 4-6 hours.  If you still have uncontrolled pain after using all opioid pills, don't hesitate to call our staff using the number provided. We will help make sure you are managing your pain in the best way possible, and if necessary, we can provide a prescription for additional pain medication.   Day 1    Time  Name of Medication Number of pills taken  Amount of Acetaminophen  Pain Level   Comments  AM PM       AM PM       AM PM       AM PM       AM PM       AM PM       AM PM       AM PM       Total Daily amount of Acetaminophen Do not take more than  3,000 mg per day      Day 2    Time  Name of Medication Number of pills taken  Amount of Acetaminophen  Pain Level   Comments  AM  PM       AM PM       AM PM       AM PM       AM PM       AM PM       AM PM       AM PM       Total Daily amount of Acetaminophen Do not take more than  3,000 mg per day      Day 3    Time  Name of Medication Number of pills taken  Amount of Acetaminophen  Pain Level   Comments  AM PM       AM PM       AM PM       AM PM          AM PM       AM PM       AM PM       AM PM       Total Daily amount of Acetaminophen Do not take more than  3,000 mg per day      Day 4    Time  Name of Medication Number of pills taken  Amount of Acetaminophen  Pain Level   Comments  AM PM       AM PM       AM PM       AM PM       AM PM       AM PM       AM PM       AM PM       Total Daily amount of Acetaminophen Do not take more than  3,000 mg per day      Day 5    Time  Name of Medication Number of pills taken  Amount of Acetaminophen  Pain Level   Comments  AM PM       AM PM       AM PM       AM PM       AM PM       AM PM       AM PM         AM PM       Total Daily amount of Acetaminophen Do not take more than  3,000 mg per day       Day 6    Time  Name of Medication Number of pills taken  Amount of Acetaminophen  Pain Level  Comments  AM PM       AM PM       AM PM       AM PM       AM PM       AM PM       AM PM       AM PM       Total Daily amount of Acetaminophen Do not take more than  3,000 mg per day      Day 7    Time  Name of Medication Number of pills taken  Amount of Acetaminophen  Pain Level   Comments  AM PM       AM PM       AM PM       AM PM       AM PM       AM PM       AM PM       AM PM       Total Daily amount of Acetaminophen Do not take more than  3,000 mg per day        For additional information about how and where to safely dispose of unused opioid medications - https://www.morepowerfulnc.org  Disclaimer: This document contains information and/or instructional materials adapted from [facility]  for the typical patient with your condition. It does not replace medical advice from your health care provider because your experience may differ from that of the typical patient. Talk to your health care provider if you have any questions about this document, your condition or your treatment plan. Adapted from [facility]  

## 2019-11-27 NOTE — Transfer of Care (Signed)
Immediate Anesthesia Transfer of Care Note  Patient: Sherry Dixon  Procedure(s) Performed: APPENDECTOMY LAPAROSCOPIC (N/A Abdomen)  Patient Location: PACU  Anesthesia Type:General  Level of Consciousness: awake, alert , oriented and patient cooperative  Airway & Oxygen Therapy: Patient Spontanous Breathing and Patient connected to face mask oxygen  Post-op Assessment: Report given to RN, Post -op Vital signs reviewed and stable and Patient moving all extremities X 4  Post vital signs: stable  Last Vitals:  Vitals Value Taken Time  BP 115/61 11/27/19 1547  Temp    Pulse 100 11/27/19 1551  Resp 17 11/27/19 1551  SpO2 96 % 11/27/19 1551  Vitals shown include unvalidated device data.  Last Pain:  Vitals:   11/27/19 1547  TempSrc:   PainSc: (P) 0-No pain         Complications: No complications documented.

## 2019-11-27 NOTE — H&P (Addendum)
[facility] Admission Note  Sherry Dixon 02/22/1956  [id number].    Requesting MD: Dalene Seltzer Chief Complaint/Reason for Consult: appendicitis HPI:  Patient is a 64 year old female who presented to [facility] with abdominal pain since Tuesday. Pain has been located across lower abdomen. Initially felt a little better Wednesday and then ate a banana and felt much worse. Pain has progressively worsened. Exacerbated by movement. Nothing has relieved pain prior to coming to ED. Associated chills and patient reports spitting up some. Patient denies fever, chest pain, SOB, diarrhea, nausea, urinary symptoms. PMH significant for HTN and anxiety. Past abdominal surgery includes abdominal hysterectomy. Patient does not take any blood thinning medications. Allergic to sulfa antibiotics and flagyl, intolerances listed to norco, morphine and ambien. Patient's son, Dr. Criss Alvine, at the bedside.   ROS: Review of Systems  Constitutional: Positive for chills. Negative for fever.  Respiratory: Negative for shortness of breath and wheezing.   Cardiovascular: Negative for chest pain and palpitations.  Gastrointestinal: Positive for abdominal pain and vomiting. Negative for diarrhea and nausea.  Genitourinary: Negative for dysuria, frequency and urgency.  All other systems reviewed and are negative.   No family history on file.  Past Medical History:  Diagnosis Date   Anxiety    Hypertension     Past Surgical History:  Procedure Laterality Date   ABDOMINAL HYSTERECTOMY     BREAST SURGERY     augmentation   [place]     DEBRIDEMENT TENNIS ELBOW     LIPOMA EXCISION      Social History:  reports that she has never smoked. She has never used smokeless tobacco. No history on file for alcohol use and drug use.  Allergies:  Allergies  Allergen Reactions   Ambien [Zolpidem Tartrate] Swelling   Flagyl [Metronidazole] Swelling   Hydrocodone-Acetaminophen Itching   Morphine And  Related Itching   Sulfa Antibiotics Hives    (Not in a hospital admission)   Blood pressure (!) 140/109, pulse 81, temperature 99.2 F (37.3 C), temperature source Oral, resp. rate 16, height 5\' 6"  (1.676 m), weight 64.5 kg, SpO2 94 %. Physical Exam:  General: pleasant, WD, WN female who is laying in bed in NAD HEENT: Sclera are noninjected.  PERRL.  Ears and nose without any masses or lesions.  Mouth is pink and moist Heart: regular, rate, and rhythm.  Palpable radial and pedal pulses bilaterally Lungs: Respiratory effort nonlabored, O2 sats stable on room air Abd: soft, peritonitis with most ttp in suprapubic abdomen, mildly distended MS: all 4 extremities are symmetrical with no cyanosis, clubbing, or edema. Skin: warm and dry with no masses, lesions, or rashes Neuro: Cranial nerves 2-12 grossly intact, sensation grossly intact throughout Psych: A&Ox3 with an appropriate affect.   Results for orders placed or performed during the hospital encounter of 11/27/19 (from the past 48 hour(s))  SARS Coronavirus 2 by RT PCR (hospital order, performed in [facility] hospital lab) Nasopharyngeal Nasopharyngeal Swab     Status: None   Collection Time: 11/27/19  9:57 AM   Specimen: Nasopharyngeal Swab  Result Value Ref Range   SARS Coronavirus 2 NEGATIVE NEGATIVE    Comment: (NOTE) SARS-CoV-2 target nucleic acids are NOT DETECTED.  The SARS-CoV-2 RNA is generally detectable in upper and lower respiratory specimens during the acute phase of infection. The lowest concentration of SARS-CoV-2 viral copies this assay can detect is 250 copies / mL. A negative result does not preclude SARS-CoV-2 infection and should not be used as the sole  basis for treatment or other patient management decisions.  A negative result may occur with improper specimen collection / handling, submission of specimen other than nasopharyngeal swab, presence of viral mutation(s) within the areas targeted by this assay,  and inadequate number of viral copies (<250 copies / mL). A negative result must be combined with clinical observations, patient history, and epidemiological information.  Fact Sheet for Patients:   BoilerBrush.com.cy  Fact Sheet for Healthcare Providers: https://pope.com/  This test is not yet approved or  cleared by the [place] FDA and has been authorized for detection and/or diagnosis of SARS-CoV-2 by FDA under an Emergency Use Authorization (EUA).  This EUA will remain in effect (meaning this test can be used) for the duration of the COVID-19 declaration under Section 564(b)(1) of the Act, 21 U.S.C. section 360bbb-3(b)(1), unless the authorization is terminated or revoked sooner.  Performed at [facility], [address] [place], [place] [address]   CBC with Differential     Status: Abnormal   Collection Time: 11/27/19 10:06 AM  Result Value Ref Range   WBC 12.6 (H) 4.0 - 10.5 K/uL   RBC 4.81 3.87 - 5.11 MIL/uL   Hemoglobin 15.9 (H) 12.0 - 15.0 g/dL   HCT 98.1 (H) 36 - 46 %   MCV 97.5 80.0 - 100.0 fL   MCH 33.1 26.0 - 34.0 pg   MCHC 33.9 30.0 - 36.0 g/dL   RDW 02.5 48.6 - 28.2 %   Platelets 253 150 - 400 K/uL   nRBC 0.0 0.0 - 0.2 %   Neutrophils Relative % 82 %   Neutro Abs 10.5 (H) 1.7 - 7.7 K/uL   Lymphocytes Relative 7 %   Lymphs Abs 0.9 0.7 - 4.0 K/uL   Monocytes Relative 7 %   Monocytes Absolute 0.9 0 - 1 K/uL   Eosinophils Relative 2 %   Eosinophils Absolute 0.2 0 - 0 K/uL   Basophils Relative 1 %   Basophils Absolute 0.1 0 - 0 K/uL   Immature Granulocytes 1 %   Abs Immature Granulocytes 0.06 0.00 - 0.07 K/uL    Comment: Performed at [facility], [address] [place], [place] [address]  Comprehensive metabolic panel     Status: Abnormal   Collection Time: 11/27/19 10:06 AM  Result Value Ref Range   Sodium 135 135 - 145 mmol/L   Potassium 4.0 3.5 - 5.1 mmol/L   Chloride 97 (L)  98 - 111 mmol/L   CO2 25 22 - 32 mmol/L   Glucose, Bld 128 (H) 70 - 99 mg/dL    Comment: Glucose reference range applies only to samples taken after fasting for at least 8 hours.   BUN 23 8 - 23 mg/dL   Creatinine, Ser 0.10 0.44 - 1.00 mg/dL   Calcium 9.4 8.9 - 40.4 mg/dL   Total Protein 7.9 6.5 - 8.1 g/dL   Albumin 4.0 3.5 - 5.0 g/dL   AST 22 15 - 41 U/L   ALT 16 0 - 44 U/L   Alkaline Phosphatase 51 38 - 126 U/L   Total Bilirubin 1.3 (H) 0.3 - 1.2 mg/dL   GFR calc non Af Amer >60 >60 mL/min   GFR calc Af Amer >60 >60 mL/min   Anion gap 13 5 - 15    Comment: Performed at [facility], [address] [place], [place] [address]  Urinalysis, Routine w reflex microscopic Nasopharyngeal Swab  Status: Abnormal   Collection Time: 11/27/19 10:07 AM  Result Value Ref Range   Color, Urine YELLOW YELLOW   APPearance HAZY (A) CLEAR   Specific Gravity, Urine >1.030 (H) 1.005 - 1.030   pH 6.0 5.0 - 8.0   Glucose, UA NEGATIVE NEGATIVE mg/dL   Hgb urine dipstick TRACE (A) NEGATIVE   Bilirubin Urine SMALL (A) NEGATIVE   Ketones, ur NEGATIVE NEGATIVE mg/dL   Protein, ur 30 (A) NEGATIVE mg/dL   Nitrite NEGATIVE NEGATIVE   Leukocytes,Ua NEGATIVE NEGATIVE    Comment: Performed at [facility], [address] [place], [place] [address]  Urinalysis, Microscopic (reflex)     Status: Abnormal   Collection Time: 11/27/19 10:07 AM  Result Value Ref Range   RBC / HPF 0-5 0 - 5 RBC/hpf   WBC, UA 0-5 0 - 5 WBC/hpf   Bacteria, UA MANY (A) NONE SEEN   Squamous Epithelial / LPF 6-10 0 - 5   Mucus PRESENT    Hyaline Casts, UA PRESENT     Comment: Performed at [facility], [address] [place], [place] [address]   CT ABDOMEN PELVIS W CONTRAST  Result Date: 11/27/2019 CLINICAL DATA:  Abdominal pain over the last 3 days. Possible diverticulitis. EXAM: CT ABDOMEN AND PELVIS WITH CONTRAST TECHNIQUE: Multidetector CT imaging of the abdomen and pelvis was performed using  the standard protocol following bolus administration of intravenous contrast. CONTRAST:  OMNIPAQUE IOHEXOL 300 MG/ML  SOLN COMPARISON:  None. FINDINGS: Lower chest: Mild linear scarring or atelectasis at the lung bases. Hepatobiliary: Scattered liver cysts, the largest in the lateral inferior right lobe measuring up to 4.8 cm in diameter. No calcified gallstones. Pancreas: Normal Spleen: Normal Adrenals/Urinary Tract: Adrenal glands are normal. Multiple bilateral renal cysts. No evidence stone solid mass or hydronephrosis. Bladder is normal. Stomach/Bowel: The stomach is normal. There are marked inflammatory changes in the pelvis. Think the patient has acute appendicitis with multiple appendicoliths. The appendix extends along the right pelvic side wall. There does not appear to be diverticulosis or diverticulitis. Some fluid in the pelvis, not clearly loculated, though early abscess is possible. Vascular/Lymphatic: Aortic atherosclerosis. No aneurysm. IVC is normal. No retroperitoneal adenopathy. Reproductive: Previous hysterectomy.  No pelvic mass. Other: None Musculoskeletal: Ordinary spinal degenerative changes. IMPRESSION: 1. Acute appendicitis with multiple appendicoliths. Marked inflammatory changes in the pelvis. Some fluid in the pelvis, not clearly loculated, though early abscess is possible. Appendix is located low in the pelvis along the right pelvic side wall. 2. Multiple liver and renal cysts. 3. Aortic atherosclerosis. Aortic Atherosclerosis (ICD10-I70.0). Electronically Signed   By: Paulina Fusi M.D.   On: 11/27/2019 11:32      Assessment/Plan HTN Anxiety  Acute appendicitis - CT shows multiple appendicoliths and possible abscess - WBC 12.6 afebrile - abdominal exam with peritonitis  - to OR for laparoscopic appendectomy   Depending on operative findings, patient will either be discharge from the PACU or possibly admitted for observation if needed.    Juliet Rude,  [facility] 11/27/2019, 12:50 PM Please see Amion for pager number during day hours 7:00am-4:30pm

## 2019-11-27 NOTE — Plan of Care (Signed)
Pt will have no complication and d/c home asap.

## 2019-11-27 NOTE — Progress Notes (Signed)
Pharmacy Antibiotic Note  Sherry Dixon is a 64 y.o. female admitted on 11/27/2019 with intra-abdominal infection.  Pharmacy has been consulted for zosyn dosing.  Plan: Zosyn 3.375g IV every 8 hours (extended infusion) Monitor renal function, clinical progression and LOT  Height: 5\' 6"  (167.6 cm) Weight: 64.5 kg (142 lb 3.2 oz) IBW/kg (Calculated) : 59.3  Temp (24hrs), Avg:99.2 F (37.3 C), Min:99.2 F (37.3 C), Max:99.2 F (37.3 C)  Recent Labs  Lab 11/27/19 1006  WBC 12.6*  CREATININE 0.95    Estimated Creatinine Clearance: 56.7 mL/min (by C-G formula based on SCr of 0.95 mg/dL).    Allergies  Allergen Reactions  . Ambien [Zolpidem Tartrate] Swelling  . Flagyl [Metronidazole] Swelling  . Hydrocodone-Acetaminophen Itching  . Morphine And Related Itching  . Sulfa Antibiotics Hives    11/29/19, PharmD Clinical Pharmacist ED Pharmacist Phone # [phone] 11/27/2019 11:58 AM

## 2019-11-27 NOTE — ED Triage Notes (Signed)
Pt arrives with c/o abdominal pain and headache since Tuesday, reports some improvement during the day Wednesday, but reports it gets worse at night. Pt denies NVD, reports abdominal pain gets worse with eating.

## 2019-11-27 NOTE — Anesthesia Postprocedure Evaluation (Signed)
Anesthesia Post Note  Patient: Sherry Dixon  Procedure(s) Performed: APPENDECTOMY LAPAROSCOPIC (N/A Abdomen)     Patient location during evaluation: PACU Anesthesia Type: General Level of consciousness: awake and alert Pain management: pain level controlled Vital Signs Assessment: post-procedure vital signs reviewed and stable Respiratory status: spontaneous breathing, nonlabored ventilation, respiratory function stable and patient connected to nasal cannula oxygen Cardiovascular status: blood pressure returned to baseline and stable Postop Assessment: no apparent nausea or vomiting Anesthetic complications: no   No complications documented.  Last Vitals:  Vitals:   11/27/19 1732 11/27/19 1835  BP: 100/66 113/68  Pulse: 75 75  Resp: 16 16  Temp: 37.2 C 36.7 C  SpO2: 95% 98%    Last Pain:  Vitals:   11/27/19 1835  TempSrc: Oral  PainSc:                  Catheryn Bacon Noemy Hallmon

## 2019-11-28 ENCOUNTER — Encounter (HOSPITAL_COMMUNITY): Payer: Self-pay | Admitting: General Surgery

## 2019-11-28 LAB — BASIC METABOLIC PANEL
Anion gap: 8 (ref 5–15)
BUN: 18 mg/dL (ref 8–23)
CO2: 24 mmol/L (ref 22–32)
Calcium: 8.4 mg/dL — ABNORMAL LOW (ref 8.9–10.3)
Chloride: 103 mmol/L (ref 98–111)
Creatinine, Ser: 0.78 mg/dL (ref 0.44–1.00)
GFR calc Af Amer: >60 mL/min (ref >60)
GFR calc non Af Amer: >60 mL/min (ref >60)
Glucose, Bld: 138 mg/dL — ABNORMAL HIGH (ref 70–99)
Potassium: 3.8 mmol/L (ref 3.5–5.1)
Sodium: 135 mmol/L (ref 135–145)

## 2019-11-28 LAB — URINE CULTURE

## 2019-11-28 LAB — CBC
HCT: 35.2 % — ABNORMAL LOW (ref 36.0–46.0)
Hemoglobin: 11.8 g/dL — ABNORMAL LOW (ref 12.0–15.0)
MCH: 34 pg (ref 26.0–34.0)
MCHC: 33.5 g/dL (ref 30.0–36.0)
MCV: 101.4 fL — ABNORMAL HIGH (ref 80.0–100.0)
Platelets: 180 10*3/uL (ref 150–400)
RBC: 3.47 MIL/uL — ABNORMAL LOW (ref 3.87–5.11)
RDW: 13.2 % (ref 11.5–15.5)
WBC: 11.5 10*3/uL — ABNORMAL HIGH (ref 4.0–10.5)
nRBC: 0 % (ref 0.0–0.2)

## 2019-11-28 NOTE — Progress Notes (Signed)
1 Day Post-Op   Subjective/Chief Complaint: Complained of some pain after eating. BP marginal   Objective: Vital signs in last 24 hours: Temp:  [97.5 F (36.4 C)-99.2 F (37.3 C)] 98.2 F (36.8 C) (08/28 1406) Pulse Rate:  [61-104] 82 (08/28 1406) Resp:  [11-20] 15 (08/28 1406) BP: (94-115)/(56-68) 102/61 (08/28 1406) SpO2:  [90 %-98 %] 90 % (08/28 1406) Last BM Date: 11/27/19  Intake/Output from previous day: 08/27 0701 - 08/28 0700 In: 4341 [P.O.:840; I.V.:2522.8; IV Piggyback:978.2] Out: 680 [Urine:630; Blood:50] Intake/Output this shift: Total I/O In: 480 [P.O.:480] Out: 1200 [Urine:1200]  General appearance: alert and cooperative Resp: clear to auscultation bilaterally Cardio: regular rate and rhythm GI: soft, appropriately tender. incisions ok  Lab Results:  Recent Labs    11/27/19 1608 11/28/19 0616  WBC 10.8* 11.5*  HGB 12.9 11.8*  HCT 38.5 35.2*  PLT 200 180   BMET Recent Labs    11/27/19 1006 11/27/19 1006 11/27/19 1608 11/28/19 0616  NA 135  --   --  135  K 4.0  --   --  3.8  CL 97*  --   --  103  CO2 25  --   --  24  GLUCOSE 128*  --   --  138*  BUN 23  --   --  18  CREATININE 0.95   < > 0.87 0.78  CALCIUM 9.4  --   --  8.4*   < > = values in this interval not displayed.   PT/INR No results for input(s): LABPROT, INR in the last 72 hours. ABG No results for input(s): PHART, HCO3 in the last 72 hours.  Invalid input(s): PCO2, PO2  Studies/Results: CT ABDOMEN PELVIS W CONTRAST  Result Date: 11/27/2019 CLINICAL DATA:  Abdominal pain over the last 3 days. Possible diverticulitis. EXAM: CT ABDOMEN AND PELVIS WITH CONTRAST TECHNIQUE: Multidetector CT imaging of the abdomen and pelvis was performed using the standard protocol following bolus administration of intravenous contrast. CONTRAST:  OMNIPAQUE IOHEXOL 300 MG/ML  SOLN COMPARISON:  None. FINDINGS: Lower chest: Mild linear scarring or atelectasis at the lung bases. Hepatobiliary:  Scattered liver cysts, the largest in the lateral inferior right lobe measuring up to 4.8 cm in diameter. No calcified gallstones. Pancreas: Normal Spleen: Normal Adrenals/Urinary Tract: Adrenal glands are normal. Multiple bilateral renal cysts. No evidence stone solid mass or hydronephrosis. Bladder is normal. Stomach/Bowel: The stomach is normal. There are marked inflammatory changes in the pelvis. Think the patient has acute appendicitis with multiple appendicoliths. The appendix extends along the right pelvic side wall. There does not appear to be diverticulosis or diverticulitis. Some fluid in the pelvis, not clearly loculated, though early abscess is possible. Vascular/Lymphatic: Aortic atherosclerosis. No aneurysm. IVC is normal. No retroperitoneal adenopathy. Reproductive: Previous hysterectomy.  No pelvic mass. Other: None Musculoskeletal: Ordinary spinal degenerative changes. IMPRESSION: 1. Acute appendicitis with multiple appendicoliths. Marked inflammatory changes in the pelvis. Some fluid in the pelvis, not clearly loculated, though early abscess is possible. Appendix is located low in the pelvis along the right pelvic side wall. 2. Multiple liver and renal cysts. 3. Aortic atherosclerosis. Aortic Atherosclerosis (ICD10-I70.0). Electronically Signed   By: Paulina Fusi M.D.   On: 11/27/2019 11:32    Anti-infectives: Anti-infectives (From admission, onward)   Start     Dose/Rate Route Frequency Ordered Stop   11/27/19 1730  piperacillin-tazobactam (ZOSYN) IVPB 3.375 g  Status:  Discontinued        3.375 g 12.5 mL/hr over  240 Minutes Intravenous Every 8 hours 11/27/19 1721 11/27/19 1725   11/27/19 1400  piperacillin-tazobactam (ZOSYN) IVPB 3.375 g  Status:  Discontinued        3.375 g 12.5 mL/hr over 240 Minutes Intravenous Every 8 hours 11/27/19 1159 11/27/19 1818      Assessment/Plan: s/p Procedure(s): APPENDECTOMY LAPAROSCOPIC (N/A) Advance diet as tolerated Continue abx Monitor BP.  Hold antihypertensive meds  LOS: 0 days    Sherry Dixon 11/28/2019

## 2019-11-29 MED ORDER — OXYCODONE HCL 5 MG PO TABS: 5.0000 mg | ORAL_TABLET | Freq: Four times a day (QID) | ORAL | 0 refills | Status: AC | PRN

## 2019-11-29 NOTE — Progress Notes (Signed)
Pt and pt's Son provided with d/c instructions. After discussing the pt's plan of care upon d\c home, the pt denied any further questions or concerns.

## 2019-11-29 NOTE — Progress Notes (Signed)
2 Days Post-Op   Subjective/Chief Complaint: Feels better today. Tolerating diet. BP better   Objective: Vital signs in last 24 hours: Temp:  [98.2 F (36.8 C)-98.9 F (37.2 C)] 98.9 F (37.2 C) (08/29 0531) Pulse Rate:  [63-82] 63 (08/29 0531) Resp:  [14-15] 14 (08/29 0531) BP: (102-117)/(61-71) 117/71 (08/29 0531) SpO2:  [90 %-94 %] 94 % (08/29 0531) Last BM Date: 11/28/19  Intake/Output from previous day: 08/28 0701 - 08/29 0700 In: 2339.4 [P.O.:600; I.V.:1739.4] Out: 2400 [Urine:2400] Intake/Output this shift: Total I/O In: 240 [P.O.:240] Out: 350 [Urine:350]  General appearance: alert and cooperative Resp: clear to auscultation bilaterally Cardio: regular rate and rhythm GI: soft, appropriately tender. incisions ok  Lab Results:  Recent Labs    11/27/19 1608 11/28/19 0616  WBC 10.8* 11.5*  HGB 12.9 11.8*  HCT 38.5 35.2*  PLT 200 180   BMET Recent Labs    11/27/19 1006 11/27/19 1006 11/27/19 1608 11/28/19 0616  NA 135  --   --  135  K 4.0  --   --  3.8  CL 97*  --   --  103  CO2 25  --   --  24  GLUCOSE 128*  --   --  138*  BUN 23  --   --  18  CREATININE 0.95   < > 0.87 0.78  CALCIUM 9.4  --   --  8.4*   < > = values in this interval not displayed.   PT/INR No results for input(s): LABPROT, INR in the last 72 hours. ABG No results for input(s): PHART, HCO3 in the last 72 hours.  Invalid input(s): PCO2, PO2  Studies/Results: CT ABDOMEN PELVIS W CONTRAST  Result Date: 11/27/2019 CLINICAL DATA:  Abdominal pain over the last 3 days. Possible diverticulitis. EXAM: CT ABDOMEN AND PELVIS WITH CONTRAST TECHNIQUE: Multidetector CT imaging of the abdomen and pelvis was performed using the standard protocol following bolus administration of intravenous contrast. CONTRAST:  OMNIPAQUE IOHEXOL 300 MG/ML  SOLN COMPARISON:  None. FINDINGS: Lower chest: Mild linear scarring or atelectasis at the lung bases. Hepatobiliary: Scattered liver cysts, the  largest in the lateral inferior right lobe measuring up to 4.8 cm in diameter. No calcified gallstones. Pancreas: Normal Spleen: Normal Adrenals/Urinary Tract: Adrenal glands are normal. Multiple bilateral renal cysts. No evidence stone solid mass or hydronephrosis. Bladder is normal. Stomach/Bowel: The stomach is normal. There are marked inflammatory changes in the pelvis. Think the patient has acute appendicitis with multiple appendicoliths. The appendix extends along the right pelvic side wall. There does not appear to be diverticulosis or diverticulitis. Some fluid in the pelvis, not clearly loculated, though early abscess is possible. Vascular/Lymphatic: Aortic atherosclerosis. No aneurysm. IVC is normal. No retroperitoneal adenopathy. Reproductive: Previous hysterectomy.  No pelvic mass. Other: None Musculoskeletal: Ordinary spinal degenerative changes. IMPRESSION: 1. Acute appendicitis with multiple appendicoliths. Marked inflammatory changes in the pelvis. Some fluid in the pelvis, not clearly loculated, though early abscess is possible. Appendix is located low in the pelvis along the right pelvic side wall. 2. Multiple liver and renal cysts. 3. Aortic atherosclerosis. Aortic Atherosclerosis (ICD10-I70.0). Electronically Signed   By: Paulina Fusi M.D.   On: 11/27/2019 11:32    Anti-infectives: Anti-infectives (From admission, onward)   Start     Dose/Rate Route Frequency Ordered Stop   11/27/19 1730  piperacillin-tazobactam (ZOSYN) IVPB 3.375 g  Status:  Discontinued        3.375 g 12.5 mL/hr over 240 Minutes Intravenous Every  8 hours 11/27/19 1721 11/27/19 1725   11/27/19 1400  piperacillin-tazobactam (ZOSYN) IVPB 3.375 g  Status:  Discontinued        3.375 g 12.5 mL/hr over 240 Minutes Intravenous Every 8 hours 11/27/19 1159 11/27/19 1818      Assessment/Plan: s/p Procedure(s): APPENDECTOMY LAPAROSCOPIC (N/A) Advance diet Discharge  LOS: 0 days    Chevis Pretty III 11/29/2019

## 2019-12-01 LAB — SURGICAL PATHOLOGY

## 2019-12-08 NOTE — Discharge Summary (Signed)
Physician Discharge Summary  Patient ID: Sherry Dixon MRN: [id number] DOB/AGE: 64-Dec-1957 64 y.o.  Admit date: 11/27/2019 Discharge date: 11/29/2019  Admission Diagnoses:  Acute appendicitis Hypertension Anxiety  Discharge Diagnoses:  Acute perforated gangrenous appendicitis Hypertension Anxiety  Active Problems:   Acute appendicitis   PROCEDURES: Laparoscopic appendectomy, 11/27/19, Dr. Jerene Dilling Course:   Patient is a 64 year old female who presented to [facility] with abdominal pain since Tuesday. Pain has been located across lower abdomen. Initially felt a little better Wednesday and then ate a banana and felt much worse. Pain has progressively worsened. Exacerbated by movement. Nothing has relieved pain prior to coming to ED. Associated chills and patient reports spitting up some. Patient denies fever, chest pain, SOB, diarrhea, nausea, urinary symptoms. PMH significant for HTN and anxiety. Past abdominal surgery includes abdominal hysterectomy. Patient does not take any blood thinning medications. Allergic to sulfa antibiotics and flagyl, intolerances listed to norco, morphine and ambien. Patient's son, Dr. Criss Alvine, at the bedside.   Pt was seen and taken to the OR by Dr. Derrell Lolling.  The appendix was perforated and gangrenous.  Pt did well and was discharged home the following day by Dr. Carolynne Edouard.  I did not see the patient.    CBC Latest Ref Rng & Units 11/28/2019 11/27/2019 11/27/2019  WBC 4.0 - 10.5 K/uL 11.5(H) 10.8(H) 12.6(H)  Hemoglobin 12.0 - 15.0 g/dL 11.8(L) 12.9 15.9(H)  Hematocrit 36 - 46 % 35.2(L) 38.5 46.9(H)  Platelets 150 - 400 K/uL 180 200 253   CMP Latest Ref Rng & Units 11/28/2019 11/27/2019 11/27/2019  Glucose 70 - 99 mg/dL 144(R) - 154(M)  BUN 8 - 23 mg/dL 18 - 23  Creatinine 0.86 - 1.00 mg/dL 7.61 9.50 9.32  Sodium 135 - 145 mmol/L 135 - 135  Potassium 3.5 - 5.1 mmol/L 3.8 - 4.0  Chloride 98 - 111 mmol/L 103 - 97(L)  CO2 22 - 32 mmol/L 24 - 25   Calcium 8.9 - 10.3 mg/dL 6.7(T) - 9.4  Total Protein 6.5 - 8.1 g/dL - - 7.9  Total Bilirubin 0.3 - 1.2 mg/dL - - 1.3(H)  Alkaline Phos 38 - 126 U/L - - 51  AST 15 - 41 U/L - - 22  ALT 0 - 44 U/L - - 16    Condition on D/C:  improved   Disposition: Discharge disposition: 01-Home or Self Care       Discharge Instructions    Call MD for:  difficulty breathing, headache or visual disturbances   Complete by: As directed    Call MD for:  extreme fatigue   Complete by: As directed    Call MD for:  hives   Complete by: As directed    Call MD for:  persistant dizziness or light-headedness   Complete by: As directed    Call MD for:  persistant nausea and vomiting   Complete by: As directed    Call MD for:  redness, tenderness, or signs of infection (pain, swelling, redness, odor or green/yellow discharge around incision site)   Complete by: As directed    Call MD for:  severe uncontrolled pain   Complete by: As directed    Call MD for:  temperature >100.4   Complete by: As directed    Diet - low sodium heart healthy   Complete by: As directed    Discharge instructions   Complete by: As directed    May shower. Diet as tolerated. No heavy lifting   Increase  activity slowly   Complete by: As directed    No wound care   Complete by: As directed      Allergies as of 11/29/2019      Reactions   Ace Inhibitors Anaphylaxis   Lisinopril Swelling   Swollen  tongue   Ambien [zolpidem Tartrate] Swelling   Flagyl [metronidazole] Swelling   Hydrocodone-acetaminophen Itching   Morphine And Related Itching   Norvasc [amlodipine] Other (See Comments)   dizziness    Sulfa Antibiotics Hives   Antihistamines, Loratadine-type Palpitations      Medication List    TAKE these medications   aspirin 325 MG tablet Take 325 mg by mouth daily.   atorvastatin 20 MG tablet Commonly known as: LIPITOR Take 20 mg by mouth at bedtime.   Caltrate 600+D Plus Minerals 600-800 MG-UNIT  Tabs Take 1 tablet by mouth in the morning and at bedtime.   Estroven Menopause Relief 4 MG Tabs Generic drug: Rhubarb Take 4 mg by mouth daily.   hydrochlorothiazide 12.5 MG tablet Commonly known as: HYDRODIURIL Take 12.5 mg by mouth daily.   ibuprofen 200 MG tablet Commonly known as: ADVIL Take 400 mg by mouth every 6 (six) hours as needed for fever, headache, mild pain, moderate pain or cramping.   losartan 100 MG tablet Commonly known as: COZAAR Take 100 mg by mouth daily.   metoprolol succinate 25 MG 24 hr tablet Commonly known as: TOPROL-XL Take 25 mg by mouth at bedtime.   oxyCODONE 5 MG immediate release tablet Commonly known as: Oxy IR/ROXICODONE Take 1-2 tablets (5-10 mg total) by mouth every 6 (six) hours as needed for moderate pain.   venlafaxine 75 MG tablet Commonly known as: EFFEXOR Take 75 mg by mouth daily.       Follow-up Information    Sur[facility], [facility]. Go on 12/15/2019.   Specialty: General Surgery Why: Follow up scheduled for 11:15 AM. Please arrive 30 min prior to appointment time. Bring photo ID and insurance information.  Contact information: [address] [place] [address] [phone]               Signed: Sherrie George 12/08/2019, 3:04 PM

## 2021-02-27 DIAGNOSIS — J0100 Acute maxillary sinusitis, unspecified: Secondary | ICD-10-CM | POA: Diagnosis not present

## 2021-02-27 DIAGNOSIS — R519 Headache, unspecified: Secondary | ICD-10-CM | POA: Diagnosis not present

## 2021-02-27 DIAGNOSIS — Z87891 Personal history of nicotine dependence: Secondary | ICD-10-CM | POA: Diagnosis not present

## 2021-02-27 DIAGNOSIS — R0981 Nasal congestion: Secondary | ICD-10-CM | POA: Diagnosis not present

## 2022-05-02 IMAGING — CT CT ABD-PELV W/ CM
2 of 5 series · 16 of 46 positions shown, 18 images · IV contrast (Omnipaque)
Comparison: None.

CLINICAL DATA: Abdominal pain over the last 3 days. Possible
diverticulitis.

EXAM:
CT ABDOMEN AND PELVIS WITH CONTRAST
TECHNIQUE: Multidetector CT imaging of the abdomen and pelvis was performed
using the standard protocol following bolus administration of
intravenous contrast.
CONTRAST:  100mL OMNIPAQUE IOHEXOL 300 MG/ML  SOLN

[Series 2: axial st · axial · 0.79mm/px · z∈[-456,-36]mm · 13 of 94 slices shown, 15 images]
[im 5/94  soft-tissue]
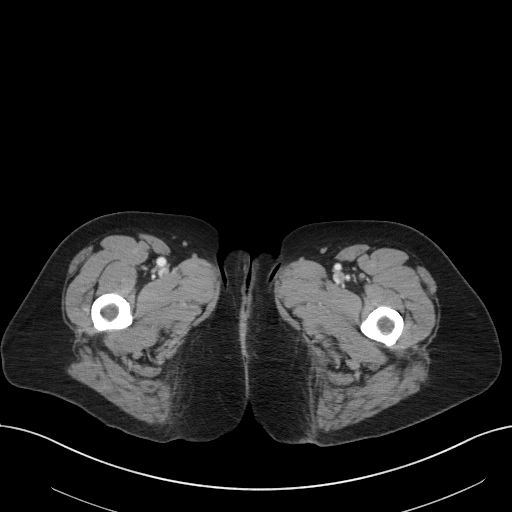
[im 5/94  bone]
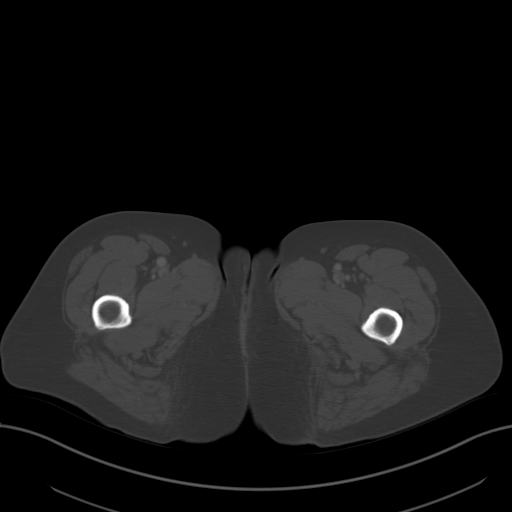
[im 15/94  soft-tissue]
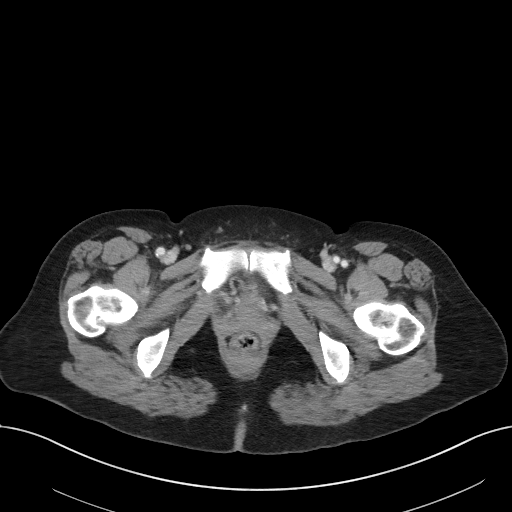
[im 20/94  soft-tissue]
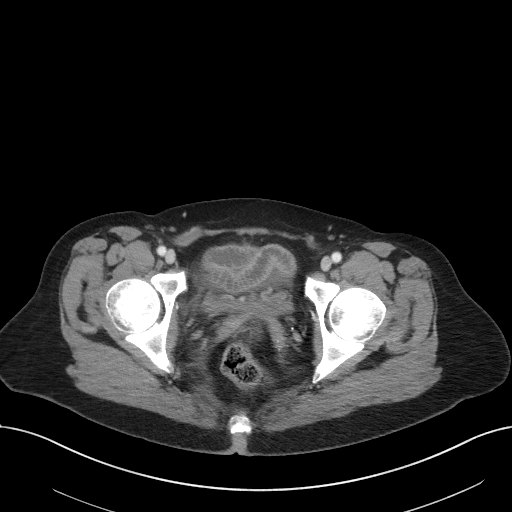
[im 25/94  soft-tissue]
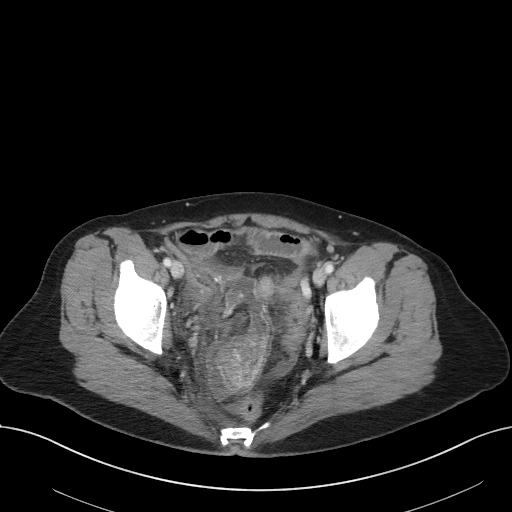
[im 35/94  soft-tissue]
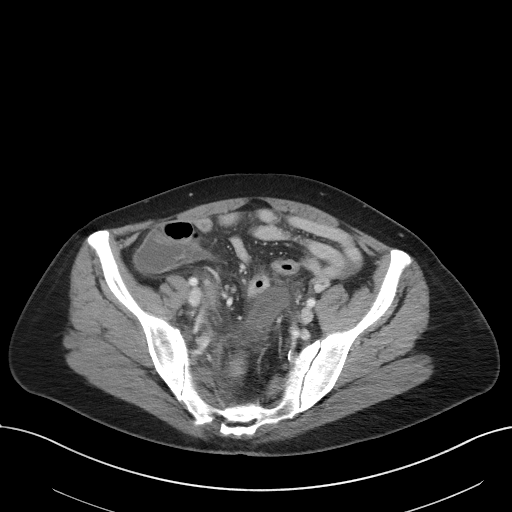
[im 40/94  soft-tissue]
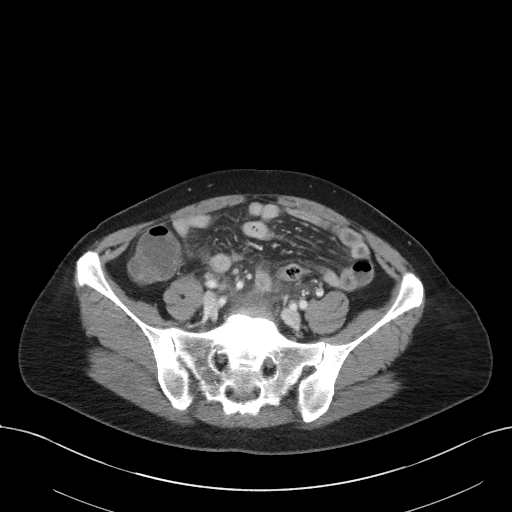
[im 49/94  soft-tissue]
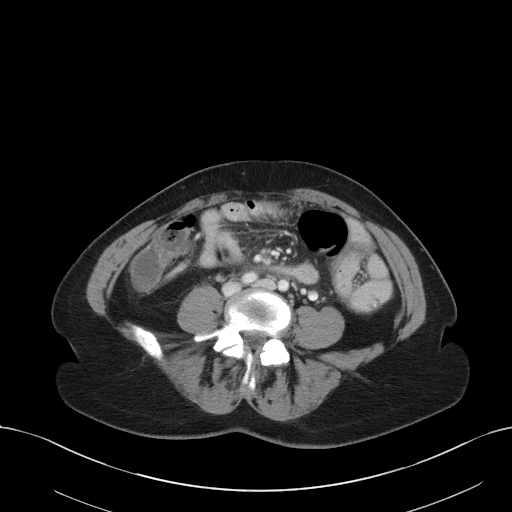
[im 54/94  soft-tissue]
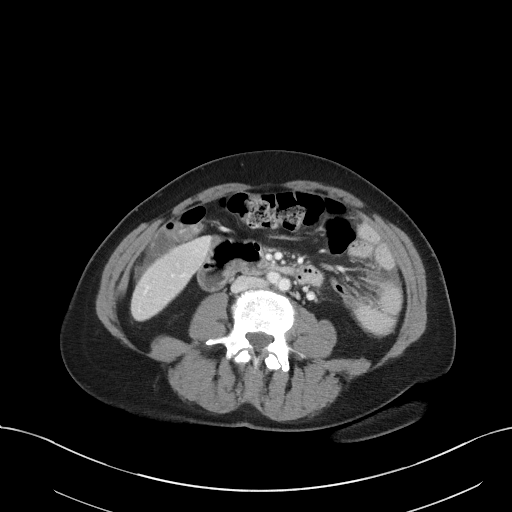
[im 59/94  soft-tissue]
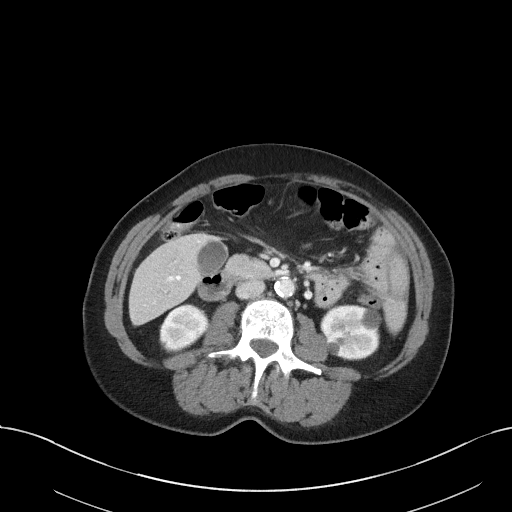
[im 59/94  bone]
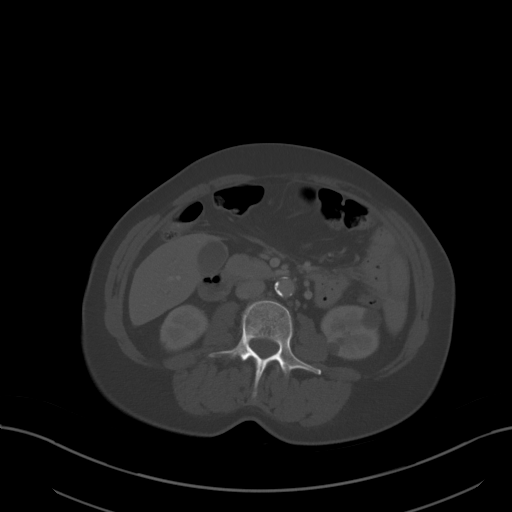
[im 69/94  soft-tissue]
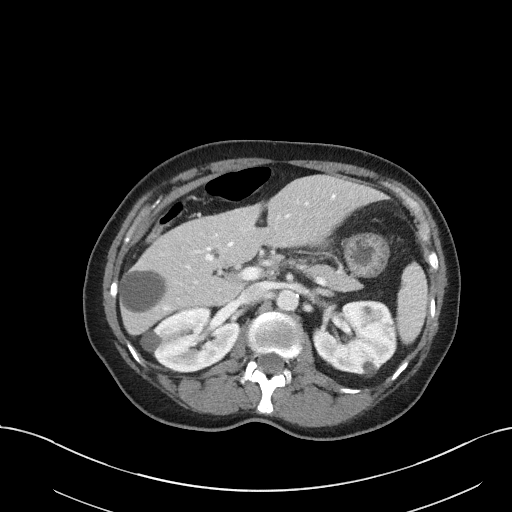
[im 74/94  soft-tissue]
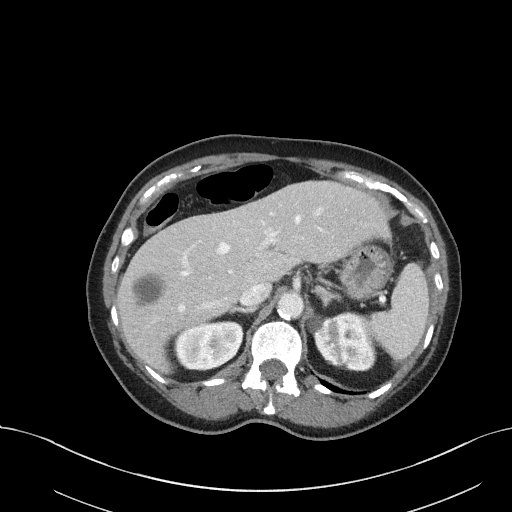
[im 79/94  soft-tissue]
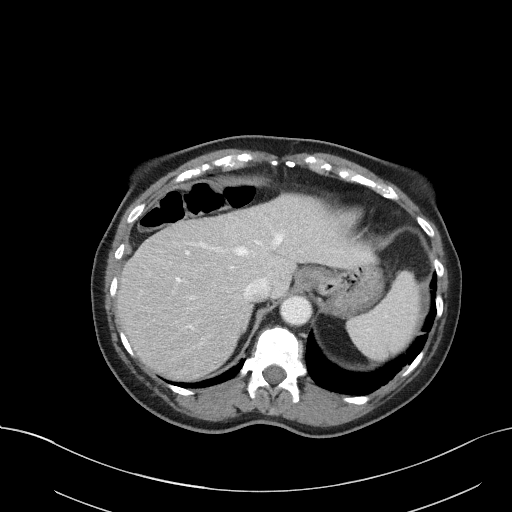
[im 89/94  soft-tissue]
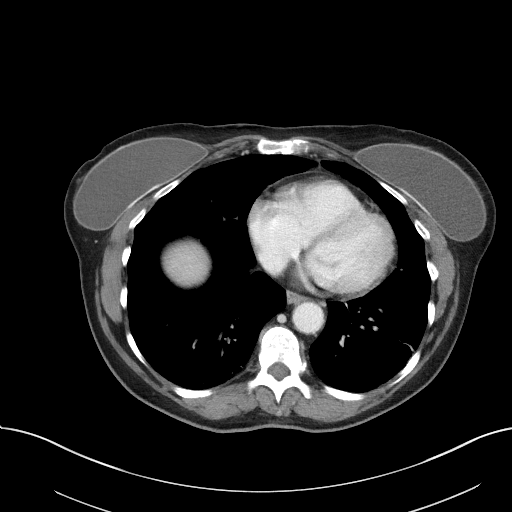

[Series 5: coronal st · coronal · 0.74mm/px · 3 of 79 slices shown]
[im 27/79  soft-tissue]
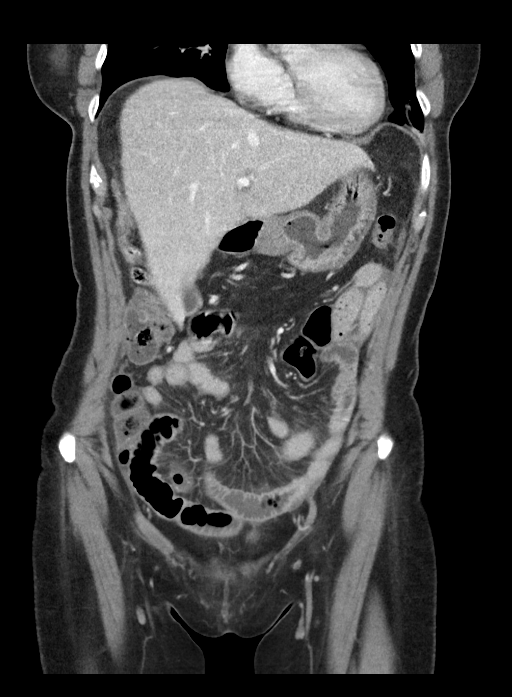
[im 35/79  soft-tissue]
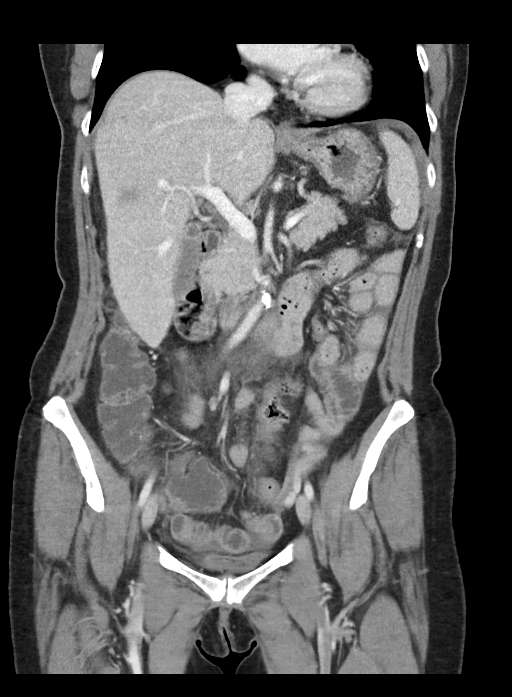
[im 44/79  soft-tissue]
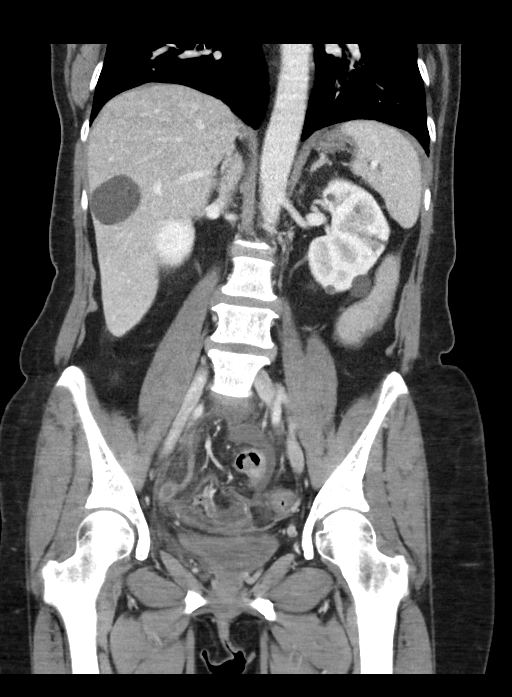

[16 of 46 positions shown; findings below may reference images not displayed]

FINDINGS: Lower chest: Mild linear scarring or atelectasis at the lung bases.

Hepatobiliary: Scattered liver cysts, the largest in the lateral
inferior right lobe measuring up to 4.8 cm in diameter. No calcified
gallstones.

Pancreas: Normal

Spleen: Normal

Adrenals/Urinary Tract: Adrenal glands are normal. Multiple
bilateral renal cysts. No evidence stone solid mass or
hydronephrosis. Bladder is normal.

Stomach/Bowel: The stomach is normal. There are marked inflammatory
changes in the pelvis. Think the patient has acute appendicitis with
multiple appendicoliths. The appendix extends along the right pelvic
side wall. There does not appear to be diverticulosis or
diverticulitis. Some fluid in the pelvis, not clearly loculated,
though early abscess is possible.

Vascular/Lymphatic: Aortic atherosclerosis. No aneurysm. IVC is
normal. No retroperitoneal adenopathy.

Reproductive: Previous hysterectomy.  No pelvic mass.

Other: None

Musculoskeletal: Ordinary spinal degenerative changes.
IMPRESSION: 1. Acute appendicitis with multiple appendicoliths. Marked
inflammatory changes in the pelvis. Some fluid in the pelvis, not
clearly loculated, though early abscess is possible. Appendix is
located low in the pelvis along the right pelvic side wall.
2. Multiple liver and renal cysts.
3. Aortic atherosclerosis.

Aortic Atherosclerosis ([id number]).
